# Patient Record
Sex: Female | Born: 1965 | Race: White | Hispanic: No | Smoking: Never smoker
Health system: Southern US, Community
[De-identification: ages and names within clinical notes are randomized; demographics above are authoritative.]

## PROBLEM LIST (undated history)

## (undated) DIAGNOSIS — B009 Herpesviral infection, unspecified: Secondary | ICD-10-CM

## (undated) DIAGNOSIS — Z973 Presence of spectacles and contact lenses: Secondary | ICD-10-CM

## (undated) DIAGNOSIS — E559 Vitamin D deficiency, unspecified: Secondary | ICD-10-CM

## (undated) HISTORY — DX: Vitamin D deficiency, unspecified: E55.9

## (undated) HISTORY — DX: Herpesviral infection, unspecified: B00.9

---

## 1994-08-27 HISTORY — PX: CERVICAL BIOPSY  W/ LOOP ELECTRODE EXCISION: SUR135

## 1998-11-04 ENCOUNTER — Encounter: Payer: Self-pay | Admitting: Emergency Medicine

## 1998-11-04 ENCOUNTER — Emergency Department (HOSPITAL_COMMUNITY): Admission: EM | Admit: 1998-11-04 | Discharge: 1998-11-04 | Payer: Self-pay | Admitting: Emergency Medicine

## 1998-12-05 ENCOUNTER — Encounter: Admission: RE | Admit: 1998-12-05 | Discharge: 1999-01-06 | Payer: Self-pay | Admitting: Family Medicine

## 1999-02-07 ENCOUNTER — Other Ambulatory Visit: Admission: RE | Admit: 1999-02-07 | Discharge: 1999-02-07 | Payer: Self-pay | Admitting: Obstetrics and Gynecology

## 2000-02-16 ENCOUNTER — Other Ambulatory Visit: Admission: RE | Admit: 2000-02-16 | Discharge: 2000-02-16 | Payer: Self-pay | Admitting: Gynecology

## 2001-03-11 ENCOUNTER — Other Ambulatory Visit: Admission: RE | Admit: 2001-03-11 | Discharge: 2001-03-11 | Payer: Self-pay | Admitting: Gynecology

## 2001-10-29 ENCOUNTER — Inpatient Hospital Stay (HOSPITAL_COMMUNITY): Admission: AD | Admit: 2001-10-29 | Discharge: 2001-10-31 | Payer: Self-pay | Admitting: Obstetrics & Gynecology

## 2001-11-26 ENCOUNTER — Other Ambulatory Visit: Admission: RE | Admit: 2001-11-26 | Discharge: 2001-11-26 | Payer: Self-pay | Admitting: Obstetrics & Gynecology

## 2002-11-10 ENCOUNTER — Other Ambulatory Visit: Admission: RE | Admit: 2002-11-10 | Discharge: 2002-11-10 | Payer: Self-pay | Admitting: Gynecology

## 2003-11-18 ENCOUNTER — Other Ambulatory Visit: Admission: RE | Admit: 2003-11-18 | Discharge: 2003-11-18 | Payer: Self-pay | Admitting: Gynecology

## 2004-03-23 ENCOUNTER — Ambulatory Visit (HOSPITAL_BASED_OUTPATIENT_CLINIC_OR_DEPARTMENT_OTHER): Admission: RE | Admit: 2004-03-23 | Discharge: 2004-03-23 | Payer: Self-pay | Admitting: Plastic Surgery

## 2004-03-23 ENCOUNTER — Ambulatory Visit (HOSPITAL_COMMUNITY): Admission: RE | Admit: 2004-03-23 | Discharge: 2004-03-23 | Payer: Self-pay | Admitting: Plastic Surgery

## 2004-03-24 ENCOUNTER — Encounter (INDEPENDENT_AMBULATORY_CARE_PROVIDER_SITE_OTHER): Payer: Self-pay | Admitting: Specialist

## 2004-08-27 DIAGNOSIS — B009 Herpesviral infection, unspecified: Secondary | ICD-10-CM

## 2004-08-27 HISTORY — DX: Herpesviral infection, unspecified: B00.9

## 2004-11-22 ENCOUNTER — Other Ambulatory Visit: Admission: RE | Admit: 2004-11-22 | Discharge: 2004-11-22 | Payer: Self-pay | Admitting: Gynecology

## 2005-11-23 ENCOUNTER — Other Ambulatory Visit: Admission: RE | Admit: 2005-11-23 | Discharge: 2005-11-23 | Payer: Self-pay | Admitting: Gynecology

## 2006-04-22 ENCOUNTER — Ambulatory Visit: Payer: Self-pay | Admitting: Internal Medicine

## 2006-04-27 ENCOUNTER — Ambulatory Visit: Payer: Self-pay | Admitting: Internal Medicine

## 2006-05-27 ENCOUNTER — Ambulatory Visit: Payer: Self-pay | Admitting: Internal Medicine

## 2006-06-27 ENCOUNTER — Ambulatory Visit: Payer: Self-pay | Admitting: Internal Medicine

## 2006-07-27 ENCOUNTER — Ambulatory Visit: Payer: Self-pay | Admitting: Internal Medicine

## 2006-09-06 ENCOUNTER — Ambulatory Visit: Payer: Self-pay | Admitting: Internal Medicine

## 2006-09-27 ENCOUNTER — Ambulatory Visit: Payer: Self-pay | Admitting: Internal Medicine

## 2006-10-26 ENCOUNTER — Ambulatory Visit: Payer: Self-pay | Admitting: Internal Medicine

## 2006-11-26 ENCOUNTER — Ambulatory Visit: Payer: Self-pay | Admitting: Internal Medicine

## 2006-12-26 ENCOUNTER — Ambulatory Visit: Payer: Self-pay | Admitting: Internal Medicine

## 2007-01-26 ENCOUNTER — Ambulatory Visit: Payer: Self-pay | Admitting: Internal Medicine

## 2007-02-25 ENCOUNTER — Ambulatory Visit: Payer: Self-pay | Admitting: Internal Medicine

## 2007-04-01 ENCOUNTER — Ambulatory Visit: Payer: Self-pay | Admitting: Internal Medicine

## 2007-04-28 ENCOUNTER — Ambulatory Visit: Payer: Self-pay | Admitting: Internal Medicine

## 2007-05-28 ENCOUNTER — Ambulatory Visit: Payer: Self-pay | Admitting: Internal Medicine

## 2007-05-30 ENCOUNTER — Other Ambulatory Visit: Admission: RE | Admit: 2007-05-30 | Discharge: 2007-05-30 | Payer: Self-pay | Admitting: Gynecology

## 2007-06-28 ENCOUNTER — Ambulatory Visit: Payer: Self-pay | Admitting: Internal Medicine

## 2007-07-28 ENCOUNTER — Ambulatory Visit: Payer: Self-pay | Admitting: Internal Medicine

## 2007-08-08 ENCOUNTER — Ambulatory Visit: Payer: Self-pay | Admitting: Internal Medicine

## 2007-08-28 ENCOUNTER — Ambulatory Visit: Payer: Self-pay | Admitting: Internal Medicine

## 2007-10-03 ENCOUNTER — Ambulatory Visit: Payer: Self-pay | Admitting: Internal Medicine

## 2007-10-26 ENCOUNTER — Ambulatory Visit: Payer: Self-pay | Admitting: Internal Medicine

## 2007-11-26 ENCOUNTER — Ambulatory Visit: Payer: Self-pay | Admitting: Internal Medicine

## 2007-12-26 ENCOUNTER — Ambulatory Visit: Payer: Self-pay | Admitting: Internal Medicine

## 2008-01-02 ENCOUNTER — Ambulatory Visit: Payer: Self-pay | Admitting: Internal Medicine

## 2008-01-26 ENCOUNTER — Ambulatory Visit: Payer: Self-pay | Admitting: Internal Medicine

## 2008-01-28 ENCOUNTER — Other Ambulatory Visit: Admission: RE | Admit: 2008-01-28 | Discharge: 2008-01-28 | Payer: Self-pay | Admitting: Gynecology

## 2008-02-25 ENCOUNTER — Ambulatory Visit: Payer: Self-pay | Admitting: Internal Medicine

## 2008-03-01 ENCOUNTER — Ambulatory Visit: Payer: Self-pay | Admitting: Internal Medicine

## 2008-03-27 ENCOUNTER — Ambulatory Visit: Payer: Self-pay | Admitting: Internal Medicine

## 2008-09-27 ENCOUNTER — Ambulatory Visit: Payer: Self-pay | Admitting: Internal Medicine

## 2008-10-21 ENCOUNTER — Ambulatory Visit: Payer: Self-pay | Admitting: Internal Medicine

## 2008-10-25 ENCOUNTER — Ambulatory Visit: Payer: Self-pay | Admitting: Internal Medicine

## 2008-12-14 ENCOUNTER — Ambulatory Visit: Payer: Self-pay | Admitting: Gynecology

## 2009-01-06 ENCOUNTER — Other Ambulatory Visit: Admission: RE | Admit: 2009-01-06 | Discharge: 2009-01-06 | Payer: Self-pay | Admitting: Gynecology

## 2009-01-06 ENCOUNTER — Encounter: Payer: Self-pay | Admitting: Gynecology

## 2009-01-06 ENCOUNTER — Ambulatory Visit: Payer: Self-pay | Admitting: Gynecology

## 2009-01-25 ENCOUNTER — Ambulatory Visit: Payer: Self-pay | Admitting: Gynecology

## 2009-01-25 ENCOUNTER — Ambulatory Visit: Payer: Self-pay | Admitting: Internal Medicine

## 2009-02-07 ENCOUNTER — Ambulatory Visit: Payer: Self-pay | Admitting: Internal Medicine

## 2009-02-24 ENCOUNTER — Ambulatory Visit: Payer: Self-pay | Admitting: Internal Medicine

## 2009-04-04 ENCOUNTER — Ambulatory Visit: Payer: Self-pay | Admitting: Women's Health

## 2009-08-27 ENCOUNTER — Ambulatory Visit: Payer: Self-pay | Admitting: Internal Medicine

## 2009-08-29 ENCOUNTER — Ambulatory Visit: Payer: Self-pay | Admitting: Internal Medicine

## 2009-09-27 ENCOUNTER — Ambulatory Visit: Payer: Self-pay | Admitting: Internal Medicine

## 2009-10-17 ENCOUNTER — Ambulatory Visit: Payer: Self-pay | Admitting: Women's Health

## 2009-10-28 ENCOUNTER — Ambulatory Visit: Payer: Self-pay | Admitting: Internal Medicine

## 2009-11-25 ENCOUNTER — Ambulatory Visit: Payer: Self-pay | Admitting: Internal Medicine

## 2010-05-25 ENCOUNTER — Ambulatory Visit: Payer: Self-pay | Admitting: Women's Health

## 2010-06-28 ENCOUNTER — Ambulatory Visit: Payer: Self-pay | Admitting: Internal Medicine

## 2010-07-27 ENCOUNTER — Ambulatory Visit: Payer: Self-pay | Admitting: Internal Medicine

## 2010-12-20 ENCOUNTER — Encounter: Payer: Self-pay | Admitting: Women's Health

## 2011-01-12 NOTE — Op Note (Signed)
Tina Allen, Tina Allen                           ACCOUNT NO.:  [id number]   MEDICAL RECORD NO.:  [id number]                   PATIENT TYPE:  AMB   LOCATION:  DSC                                  FACILITY:  [facility]   PHYSICIAN:  Brantley Persons, M.D.             DATE OF BIRTH:  11/29/1965   DATE OF PROCEDURE:  03/23/2004  DATE OF DISCHARGE:                                 OPERATIVE REPORT   PREOPERATIVE DIAGNOSES:  1. 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  2. Suspicious skin lesion of left flank area.  3. Suspicious skin lesion of medial right breast.   POSTOPERATIVE DIAGNOSES:  1. 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  2. Suspicious skin lesion of left flank area.  3. Suspicious skin lesion of medial right breast.   OPERATION PERFORMED:  1. Wide local excision of 2.5 cm right thigh dysplastic nevus/possible early     melanoma.  2. Complex closure of 6.0 cm right anterior thigh incision.  3. Excision of 0.4 cm left flank suspicious skin lesion.  4. Excision of 0.7 cm suspicious skin lesion, right medial breast.  5. Intermediate closure of 1.3 cm right medial breast skin incision.   SURGEON:  Brantley Persons, M.D.   ANESTHESIA:  1% lidocaine with epinephrine.   COMPLICATIONS:  None.   INDICATIONS FOR PROCEDURE:  The patient is a 45 year old Caucasian female  who is referred by Dr. Campbell Stall for wide local excision of a dysplastic  nevus of the right anterior thigh that shows marked atypia and borders on  early evolving melanoma along with suspicious skin lesions on the left flank  and right medial breast areas.  The patient is an appropriate candidate and  would like to proceed with these surgeries.   DESCRIPTION OF PROCEDURE:  The patient was brought into the minor room and  placed on the table in the supine position.  The  left flank, right medial  breast and right anterior thigh operative areas were then infiltrated with  1% lidocaine with epinephrine.  All of the skin in these areas was also  prepped with soap, Betadine and alcohol and then draped in sterile fashion.  After adequate hemostasis and anesthesia had taken effect, the procedure was  begun.   I first proceeded with excision of the left flank suspicious skin lesion.  This skin lesion was excised full thickness through the skin into the  subcutaneous tissue with at least 1 mm margins.  This specimen was marked at  the 12 o'clock position and then passed off the table to undergo permanent  pathologic section evaluation.  The incision was then closed with 5-0  Prolene interrupted simple sutures.  This incision was then dressed with  Bacitracin ointment and a light gauze dressing. Attention was then turned to  the right medial breast.  This suspicious skin lesion was examined using  loupe magnification and 1 to 2 mm margins were marked circumferentially  around the skin lesion.  The skin lesion was thus excised with appropriate  clinical skin margins. This specimen was marked at the 12 o'clock position  and passed off the table to undergo permanent pathologic section evaluation.  The total length of excision was 0.8 cm.  The incision was then closed in  intermediate grade fashion.  The deeper subcutaneous tissues and deep dermal  layers were closed using 3-0 Monocryl suture.  The skin was then closed with  a running 4-0 Monocryl intracuticular suture.  The incision was dressed with  benzoin and Steri-Strips followed by a light gauze dressing.  Attention was  then turned to the right anterior thigh.  Again using loupe magnification,  at least 5 mm borders were marked circumferentially around the healing  biopsy site and persistent skin lesion.  The skin lesion was thus excised  full thickness through the skin into the subcutaneous tissues and  actually  down to the muscle fascial layer.  The specimen was marked at the 12 o'clock  position and then passed off the table to undergo permanent pathologic  section evaluation.  The total length of excised skin lesion with the  appropriate margins was 2.5 cm.  The wound closure then was performed in  complex fashion.  The subfascial layers were closed using 2-0 Monocryl  suture.  The deep dermis was then closed using a 3-0 Monocryl suture.  The  skin was then closed with a 3-0 Monocryl suture in an intracuticular  pattern.  Benzoin and Steri-Strips followed by a light gauze dressing was  applied.  There were no complications.  The patient tolerated the procedure  well.  She was then given proper postoperative wound care instructions and  discharged home in stable condition.  Follow-up appointment will be tomorrow  in the office.                                               Brantley Persons, M.D.    MC/MEDQ  D:  03/24/2004  T:  03/24/2004  Job:  [id number]

## 2011-08-03 ENCOUNTER — Telehealth: Payer: Self-pay | Admitting: *Deleted

## 2011-08-03 NOTE — Telephone Encounter (Signed)
Patient called to say she was having problems with gas and reflux since last month.  Advised the patient to see a PCP to evaluate.

## 2011-09-05 ENCOUNTER — Ambulatory Visit: Payer: Self-pay | Admitting: Women's Health

## 2011-09-06 ENCOUNTER — Other Ambulatory Visit: Payer: Self-pay | Admitting: Gynecology

## 2011-09-06 ENCOUNTER — Other Ambulatory Visit (HOSPITAL_COMMUNITY)
Admission: RE | Admit: 2011-09-06 | Discharge: 2011-09-06 | Disposition: A | Discharge status: Home / Self Care | Payer: Self-pay | Source: Ambulatory Visit | Attending: Gynecology | Admitting: Gynecology

## 2011-09-06 ENCOUNTER — Ambulatory Visit (INDEPENDENT_AMBULATORY_CARE_PROVIDER_SITE_OTHER): Payer: Self-pay | Admitting: Gynecology

## 2011-09-06 ENCOUNTER — Encounter: Payer: Self-pay | Admitting: Gynecology

## 2011-09-06 DIAGNOSIS — Z01419 Encounter for gynecological examination (general) (routine) without abnormal findings: Secondary | ICD-10-CM

## 2011-09-06 DIAGNOSIS — A499 Bacterial infection, unspecified: Secondary | ICD-10-CM

## 2011-09-06 DIAGNOSIS — N760 Acute vaginitis: Secondary | ICD-10-CM

## 2011-09-06 DIAGNOSIS — N898 Other specified noninflammatory disorders of vagina: Secondary | ICD-10-CM

## 2011-09-06 DIAGNOSIS — Z23 Encounter for immunization: Secondary | ICD-10-CM

## 2011-09-06 DIAGNOSIS — N949 Unspecified condition associated with female genital organs and menstrual cycle: Secondary | ICD-10-CM

## 2011-09-06 LAB — CBC WITH DIFFERENTIAL/PLATELET
Basophils Absolute: 0 10*3/uL (ref 0.0–0.1)
Basophils Relative: 0 % (ref 0–1)
HCT: 40.6 % (ref 36.0–46.0)
Hemoglobin: 13.4 g/dL (ref 12.0–15.0)
Lymphocytes Relative: 39 % (ref 12–46)
Monocytes Absolute: 0.4 10*3/uL (ref 0.1–1.0)
Monocytes Relative: 5 % (ref 3–12)
Neutro Abs: 4 10*3/uL (ref 1.7–7.7)
Neutrophils Relative %: 53 % (ref 43–77)
WBC: 7.5 10*3/uL (ref 4.0–10.5)

## 2011-09-06 LAB — URINALYSIS W MICROSCOPIC + REFLEX CULTURE
Bilirubin Urine: NEGATIVE
Crystals: NONE SEEN
Glucose, UA: NEGATIVE mg/dL
Protein, ur: NEGATIVE mg/dL
RBC / HPF: NONE SEEN RBC/hpf (ref <3)
Specific Gravity, Urine: 1.015 (ref 1.005–1.030)
Urobilinogen, UA: 0.2 mg/dL (ref 0.0–1.0)

## 2011-09-06 LAB — WET PREP FOR TRICH, YEAST, CLUE
Trich, Wet Prep: NONE SEEN
Yeast Wet Prep HPF POC: NONE SEEN

## 2011-09-06 LAB — COMPREHENSIVE METABOLIC PANEL
Albumin: 4.7 g/dL (ref 3.5–5.2)
BUN: 10 mg/dL (ref 6–23)
CO2: 28 mEq/L (ref 19–32)
Calcium: 9.5 mg/dL (ref 8.4–10.5)
Chloride: 104 mEq/L (ref 96–112)
Creat: 0.9 mg/dL (ref 0.50–1.10)
Glucose, Bld: 70 mg/dL (ref 70–99)
Potassium: 3.9 mEq/L (ref 3.5–5.3)

## 2011-09-06 LAB — IRON AND TIBC
%SAT: 28 % (ref 20–55)
Iron: 77 ug/dL (ref 42–145)
TIBC: 277 ug/dL (ref 250–470)
UIBC: 200 ug/dL (ref 125–400)

## 2011-09-06 MED ORDER — METRONIDAZOLE 500 MG PO TABS: 500.0000 mg | ORAL_TABLET | Freq: Two times a day (BID) | ORAL | Status: AC

## 2011-09-06 NOTE — Patient Instructions (Addendum)
Would encourage calcium with vitamin D one tablet daily for osteoporosis prevention.Remember to schedule your mammogram.Prescription in pharmacy.  Breast Self-Exam A self breast exam may help you find changes or problems while they are still small. Do a breast self-exam:  Every month.   One week after your period (menstrual period).   On the first day of each month if you do not have periods anymore.  Look for any:  Change in breast color, size, or shape.   Dimples in your breast.   Changes in your nipples or skin.   Dry skin on your breasts or nipples.   Watery or bloody discharge from your nipples.   Feel for:  Lumps.   Thick, hard places.   Any other changes.  HOME CARE There are 3 ways to do the breast self-exam: In front of a mirror.  Lift your arms over your head and turn side to side.   Put your hands on your hips and lean down, then turn from side to side.   Bend forward and turn from side to side.  In the shower.  With soapy hands, check both breasts. Then check above and below your collarbone and your armpits.   Feel above and below your collarbone down to under your breast, and from the center of your chest to the outer edge of the armpit. Check for any lumps or hard spots.   Using the tips of your middle three fingers check your whole breast by pressing your hand over your breast in a circle or in an up and down motion.  Lying down.  Lie flat on your bed.   Put a small pillow under the breast you are going to check. On that same side, put your hand behind your head.   With your other hand, use the 3 middle fingers to feel the breast.   Move your fingers in a circle around the breast. Press firmly over all parts of the breast to feel for any lumps.  GET HELP RIGHT AWAY IF: You find any changes in your breasts so they can be checked. Document Released: 01/30/2008 Document Revised: 04/25/2011 Document Reviewed: 12/01/2008 [facility] Patient Information  2012 [place], [place].

## 2011-09-06 NOTE — Progress Notes (Signed)
Tina Allen Aug 31, 1965 [id number]   History:    46 y.o.  for annual exam who was complaining of vaginal discharge with odor. She states that time her cycles are getting heavier. The last time she was seen in the office was in 2011. Review of her record indicated she had a LEEP cervical conization back in 1996 followup Pap smears have been normal. Patient was diagnosed with hemochromatosis and has a family history. She underwent phlebotomy at [facility] in [facility] by hematologist. Patient is not using any form of contraception cycles reported to be normal in frequently does her self breast examination and mammogram was done over year ago. Patient requesting a flu vaccine.    Past medical history,surgical history, family history and social history were all reviewed and documented in the EPIC chart.  Gynecologic History Patient's last menstrual period was 08/30/2011. Contraception: none Last Pap: 2011. Results were: normal Last mammogram: Over year ago. Results were: normal  Obstetric History OB History    Grav Para Term Preterm Abortions TAB SAB Ect Mult Living   2 1 1  1  1   1      # Outc Date GA Lbr Len/2nd Wgt Sex Del Anes PTL Lv   1 TRM     F SVD  No Yes   2 SAB                ROS:  Was performed and pertinent positives and negatives are included in the history.  Exam: chaperone present  BP 118/72  Ht 5\' 4"  (1.626 m)  Wt 150 lb (68.04 kg)  BMI 25.75 kg/m2  LMP 08/30/2011  Body mass index is 25.75 kg/(m^2).  General appearance : Well developed well nourished female. No acute distress HEENT: Neck supple, trachea midline, no carotid bruits, no thyroidmegaly Lungs: Clear to auscultation, no rhonchi or wheezes, or rib retractions  Heart: Regular rate and rhythm, no murmurs or gallops Breast:Examined in sitting and supine position were symmetrical in appearance, no palpable masses or tenderness,  no skin retraction, no nipple inversion,  no nipple discharge, no skin discoloration, no axillary or supraclavicular lymphadenopathy Abdomen: no palpable masses or tenderness, no rebound or guarding Extremities: no edema or skin discoloration or tenderness  Pelvic:  Bartholin, Urethra, Skene Glands: Within normal limits             Vagina: No gross lesions or discharge  Cervix: No gross lesions or discharge  Uterus  anteverted, normal size, shape and consistency, non-tender and mobile  Adnexa  Without masses or tenderness  Anus and perineum  normal   Rectovaginal  normal sphincter tone without palpated masses or tenderness             Hemoccult not done  Wet prep demonstrated white blood cells /clue cells present     Assessment/Plan:  46 y.o. female for annual exam with evidence of bacterial vaginosis. She will be placed on Flagyl 500 mg twice a day for 5 days. Because of her history of hemochromatosis a full anemia panel will be drawn today along with a comprehensive metabolic panel, CBC, urinalysis and Pap smear. She was encouraged to do her monthly self breast examination and a requisition was provided for to schedule her mammogram. Flu vaccine was administered today. Patient also informed that there is family history of epidermolysis bullosa hereditaria in her brother and father which is inherited in males. We'll see patient one year or when necessary.   Ok Edwards MD,  5:21 PM 09/06/2011

## 2011-09-07 LAB — VITAMIN B12: Vitamin B-12: 420 pg/mL (ref 211–911)

## 2011-09-07 LAB — FERRITIN: Ferritin: 16 ng/mL (ref 10–291)

## 2011-09-07 LAB — FOLATE: Folate: 12.8 ng/mL

## 2011-09-08 LAB — URINE CULTURE: Organism ID, Bacteria: NO GROWTH

## 2011-10-05 ENCOUNTER — Telehealth: Payer: Self-pay | Admitting: *Deleted

## 2011-10-05 MED ORDER — FLUCONAZOLE 150 MG PO TABS: 150.0000 mg | ORAL_TABLET | Freq: Once | ORAL | Status: AC

## 2011-10-05 NOTE — Telephone Encounter (Signed)
Pt was seen on 09/06/11 for annual and given flagyl 500 mg for BV. Pt took all medication and now for 2 days having itching, white discharge and slight odor. Pt would like rx to help relieve symptoms. Please advise

## 2011-10-05 NOTE — Telephone Encounter (Signed)
Pt informed with the below note, rx sent pt pharmacy. 

## 2011-10-05 NOTE — Telephone Encounter (Signed)
For patient's apparent yeast infection she can have Diflucan 150 mg one by mouth today and 2 refills.

## 2011-11-30 ENCOUNTER — Ambulatory Visit (INDEPENDENT_AMBULATORY_CARE_PROVIDER_SITE_OTHER): Payer: Self-pay | Admitting: Women's Health

## 2011-11-30 ENCOUNTER — Encounter: Payer: Self-pay | Admitting: Women's Health

## 2011-11-30 DIAGNOSIS — N760 Acute vaginitis: Secondary | ICD-10-CM

## 2011-11-30 DIAGNOSIS — B9689 Other specified bacterial agents as the cause of diseases classified elsewhere: Secondary | ICD-10-CM

## 2011-11-30 DIAGNOSIS — N898 Other specified noninflammatory disorders of vagina: Secondary | ICD-10-CM

## 2011-11-30 DIAGNOSIS — A499 Bacterial infection, unspecified: Secondary | ICD-10-CM

## 2011-11-30 LAB — WET PREP FOR TRICH, YEAST, CLUE: Yeast Wet Prep HPF POC: NONE SEEN

## 2011-11-30 MED ORDER — METRONIDAZOLE 500 MG PO TABS: 500.0000 mg | ORAL_TABLET | Freq: Two times a day (BID) | ORAL | Status: AC

## 2011-11-30 NOTE — Patient Instructions (Signed)
Bacterial Vaginosis Bacterial vaginosis (BV) is a vaginal infection where the normal balance of bacteria in the vagina is disrupted. The normal balance is then replaced by an overgrowth of certain bacteria. There are several different kinds of bacteria that can cause BV. BV is the most common vaginal infection in women of childbearing age. CAUSES   The cause of BV is not fully understood. BV develops when there is an increase or imbalance of harmful bacteria.   Some activities or behaviors can upset the normal balance of bacteria in the vagina and put women at increased risk including:   Having a new sex partner or multiple sex partners.   Douching.   Using an intrauterine device (IUD) for contraception.   It is not clear what role sexual activity plays in the development of BV. However, women that have never had sexual intercourse are rarely infected with BV.  Women do not get BV from toilet seats, bedding, swimming pools or from touching objects around them.  SYMPTOMS   Grey vaginal discharge.   A fish-like odor with discharge, especially after sexual intercourse.   Itching or burning of the vagina and vulva.   Burning or pain with urination.   Some women have no signs or symptoms at all.  DIAGNOSIS  Your caregiver must examine the vagina for signs of BV. Your caregiver will perform lab tests and look at the sample of vaginal fluid through a microscope. They will look for bacteria and abnormal cells (clue cells), a pH test higher than 4.5, and a positive amine test all associated with BV.  RISKS AND COMPLICATIONS   Pelvic inflammatory disease (PID).   Infections following gynecology surgery.   Developing HIV.   Developing herpes virus.  TREATMENT  Sometimes BV will clear up without treatment. However, all women with symptoms of BV should be treated to avoid complications, especially if gynecology surgery is planned. Female partners generally do not need to be treated. However,  BV may spread between female sex partners so treatment is helpful in preventing a recurrence of BV.   BV may be treated with antibiotics. The antibiotics come in either pill or vaginal cream forms. Either can be used with nonpregnant or pregnant women, but the recommended dosages differ. These antibiotics are not harmful to the baby.   BV can recur after treatment. If this happens, a second round of antibiotics will often be prescribed.   Treatment is important for pregnant women. If not treated, BV can cause a premature delivery, especially for a pregnant woman who had a premature birth in the past. All pregnant women who have symptoms of BV should be checked and treated.   For chronic reoccurrence of BV, treatment with a type of prescribed gel vaginally twice a week is helpful.  HOME CARE INSTRUCTIONS   Finish all medication as directed by your caregiver.   Do not have sex until treatment is completed.   Tell your sexual partner that you have a vaginal infection. They should see their caregiver and be treated if they have problems, such as a mild rash or itching.   Practice safe sex. Use condoms. Only have 1 sex partner.  PREVENTION  Basic prevention steps can help reduce the risk of upsetting the natural balance of bacteria in the vagina and developing BV:  Do not have sexual intercourse (be abstinent).   Do not douche.   Use all of the medicine prescribed for treatment of BV, even if the signs and symptoms go away.     Tell your sex partner if you have BV. That way, they can be treated, if needed, to prevent reoccurrence.  SEEK MEDICAL CARE IF:   Your symptoms are not improving after 3 days of treatment.   You have increased discharge, pain, or fever.  MAKE SURE YOU:   Understand these instructions.   Will watch your condition.   Will get help right away if you are not doing well or get worse.  FOR MORE INFORMATION  Division of STD Prevention (DSTDP), Centers for Disease  Control and Prevention: www.cdc.gov/std American Social Health Association (ASHA): www.ashastd.org  Document Released: 08/13/2005 Document Revised: 08/02/2011 Document Reviewed: 02/03/2009 [employer] Patient Information 2012 [employer]. 

## 2011-11-30 NOTE — Progress Notes (Signed)
Patient ID: Tina Allen, female   DOB: [birth year], 46 y.o.   MRN: [id number] Presents with a complaint of a discharge with an odor. Denies any urinary symptoms. One partner negative cultures. Monthly cycles/condoms for contraception.  Exam: External genitalia is slightly erythematous and introitus, speculum exam scant clear discharge with an odor was present. Wet prep positive for amines and clues. Bimanual no CMT no adenexal  fullness or tenderness.  Bacteria vaginosis  Plan: Flagyl 500 by mouth twice a day for 7 days, alcohol precautions reviewed, call or return if no relief of symptoms. Continue condoms for contraception. Overdue for annual exam instructed to schedule

## 2012-03-10 ENCOUNTER — Telehealth: Payer: Self-pay | Admitting: *Deleted

## 2012-03-10 ENCOUNTER — Other Ambulatory Visit: Payer: Self-pay | Admitting: Women's Health

## 2012-03-10 DIAGNOSIS — B9689 Other specified bacterial agents as the cause of diseases classified elsewhere: Secondary | ICD-10-CM

## 2012-03-10 DIAGNOSIS — N760 Acute vaginitis: Secondary | ICD-10-CM

## 2012-03-10 MED ORDER — METRONIDAZOLE 0.75 % VA GEL: VAGINAL | Status: AC

## 2012-03-10 NOTE — Telephone Encounter (Signed)
Telephone call, reviewed flagyl helped but problem returned, states happens every month before cycle.  Same partner, no other changes.  Will try metrogel 1 applicator HS for 5 nights then 1 applicator prior to cycle.  Office visit if no relief

## 2012-03-10 NOTE — Telephone Encounter (Signed)
Pt calling c/o BV infection odor with discharge, Ov offered pt declined she doesn't have money for ov and rx. She would like to know what you recommend her to take OTC that would help with this infection. Please advise

## 2012-04-08 ENCOUNTER — Encounter: Payer: Self-pay | Admitting: Gynecology

## 2012-04-08 ENCOUNTER — Ambulatory Visit (INDEPENDENT_AMBULATORY_CARE_PROVIDER_SITE_OTHER): Payer: Self-pay | Admitting: Gynecology

## 2012-04-08 VITALS — BP 120/82

## 2012-04-08 DIAGNOSIS — R350 Frequency of micturition: Secondary | ICD-10-CM

## 2012-04-08 DIAGNOSIS — N390 Urinary tract infection, site not specified: Secondary | ICD-10-CM

## 2012-04-08 LAB — URINALYSIS W MICROSCOPIC + REFLEX CULTURE
Casts: NONE SEEN
Ketones, ur: NEGATIVE mg/dL
Nitrite: NEGATIVE
Protein, ur: >300 mg/dL — AB
Urobilinogen, UA: 0.2 mg/dL (ref 0.0–1.0)
pH: 5.5 (ref 5.0–8.0)

## 2012-04-08 MED ORDER — NITROFURANTOIN MONOHYD MACRO 100 MG PO CAPS: 100.0000 mg | ORAL_CAPSULE | Freq: Two times a day (BID) | ORAL | Status: AC

## 2012-04-08 MED ORDER — FLUCONAZOLE 100 MG PO TABS: ORAL_TABLET | ORAL | Status: DC

## 2012-04-08 MED ORDER — PHENAZOPYRIDINE HCL 95 MG PO TABS: 95.0000 mg | ORAL_TABLET | Freq: Three times a day (TID) | ORAL | Status: AC | PRN

## 2012-04-08 NOTE — Patient Instructions (Signed)

## 2012-04-08 NOTE — Progress Notes (Signed)
46 year old patient who presented to the office today complaining today history of dysuria and frequency. Patient denies any fever chills nausea vomiting her back pain. She denied any vaginal discharge. Her menstrual cycles reported to be regular and her husband has had a vasectomy.  Exam: Abdomen: Soft nontender no rebound or guarding Pelvic: Bartholin urethra Skene was within normal limits Vagina: No lesion discharge Cervix: No lesions or discharge Uterus: Anteverted normal size shape and consistency some suprapubic tenderness are noted Adnexa: No palpable masses or tenderness Rectal exam: Not done  Urinalysis demonstrated tumors to count white blood cells, 11-20 RBC, and few bacteria.  Assessment/plan: Urinary tract infection. She will prescribe Macrobid to take 1 by mouth twice a day for 7 days.  In 200 mg one by mouth 3 times a day for to 3 days as an anti-spasmodic agent. Since patient states that she is very sensitive to antibiotics and is prone to yeast infection a prescription for Diflucan 100 mg to take 1 by mouth after the antibiotics was prescribed. Patient states at times she suffers from menorrhagia and was interested in the her option endometrial ablation technique. Literature formation was provided and she will read it and let us know and we will schedule accordingly.

## 2012-04-11 LAB — URINE CULTURE: Colony Count: >=100000

## 2013-04-17 ENCOUNTER — Encounter: Payer: Self-pay | Admitting: Women's Health

## 2013-04-23 ENCOUNTER — Encounter: Payer: Self-pay | Admitting: Women's Health

## 2013-04-23 ENCOUNTER — Ambulatory Visit (INDEPENDENT_AMBULATORY_CARE_PROVIDER_SITE_OTHER): Payer: BC Managed Care – PPO | Admitting: Women's Health

## 2013-04-23 ENCOUNTER — Other Ambulatory Visit (HOSPITAL_COMMUNITY)
Admission: RE | Admit: 2013-04-23 | Discharge: 2013-04-23 | Disposition: A | Discharge status: Home / Self Care | Payer: BC Managed Care – PPO | Source: Ambulatory Visit | Attending: Gynecology | Admitting: Gynecology

## 2013-04-23 VITALS — BP 112/74 | Ht 65.0 in | Wt 140.0 lb

## 2013-04-23 DIAGNOSIS — Z13220 Encounter for screening for lipoid disorders: Secondary | ICD-10-CM

## 2013-04-23 DIAGNOSIS — Z833 Family history of diabetes mellitus: Secondary | ICD-10-CM

## 2013-04-23 DIAGNOSIS — Z01419 Encounter for gynecological examination (general) (routine) without abnormal findings: Secondary | ICD-10-CM | POA: Insufficient documentation

## 2013-04-23 DIAGNOSIS — F418 Other specified anxiety disorders: Secondary | ICD-10-CM

## 2013-04-23 DIAGNOSIS — F411 Generalized anxiety disorder: Secondary | ICD-10-CM

## 2013-04-23 DIAGNOSIS — N879 Dysplasia of cervix uteri, unspecified: Secondary | ICD-10-CM | POA: Insufficient documentation

## 2013-04-23 DIAGNOSIS — E079 Disorder of thyroid, unspecified: Secondary | ICD-10-CM

## 2013-04-23 DIAGNOSIS — F341 Dysthymic disorder: Secondary | ICD-10-CM

## 2013-04-23 LAB — CBC WITH DIFFERENTIAL/PLATELET
Basophils Relative: 1 % (ref 0–1)
Eosinophils Absolute: 0.1 10*3/uL (ref 0.0–0.7)
MCH: 31.2 pg (ref 26.0–34.0)
MCHC: 33.7 g/dL (ref 30.0–36.0)
Neutro Abs: 4.2 10*3/uL (ref 1.7–7.7)
Neutrophils Relative %: 55 % (ref 43–77)
Platelets: 223 10*3/uL (ref 150–400)
RBC: 4.43 MIL/uL (ref 3.87–5.11)

## 2013-04-23 MED ORDER — ESCITALOPRAM OXALATE 10 MG PO TABS: 10.0000 mg | ORAL_TABLET | Freq: Every day | ORAL | Status: DC

## 2013-04-23 NOTE — Progress Notes (Signed)
Tina Allen 10-22-1965 [id number]    History:    The patient presents for annual exam.  Monthly cycle/vasectomy. HSV history rare outbreaks. LEEP 1996, LGSIL with negative colposcopy/biopsy 2010, normal Paps after. History of hemochromatosis phlebotomy in the past has not had recent followup. Normal mammogram history, overdue.   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 Mother hypertension, aneurysmal 22, father stroke 15. Maternal grandmother breast cancer.   ROS:  A  ROS was performed and pertinent positives and negatives are included in the history.  Exam:  Filed Vitals:   04/23/13 1501  BP: 112/74    General appearance:  Normal Head/Neck:  Normal, without cervical or supraclavicular adenopathy. Thyroid:  Symmetrical, normal in size, without palpable masses or nodularity. Respiratory  Effort:  Normal  Auscultation:  Clear without wheezing or rhonchi Cardiovascular  Auscultation:  Regular rate, without rubs, murmurs or gallops  Edema/varicosities:  Not grossly evident Abdominal  Soft,nontender, without masses, guarding or rebound.  Liver/spleen:  No organomegaly noted  Hernia:  None appreciated  Skin  Inspection:  Grossly normal  Palpation:  Grossly normal Neurologic/psychiatric  Orientation:  Normal with appropriate conversation.  Mood/affect:  Normal  Genitourinary    Breasts: Examined lying and sitting.     Right: Without masses, retractions, discharge or axillary adenopathy.     Left: Without masses, retractions, discharge or axillary adenopathy.   Inguinal/mons:  Normal without inguinal adenopathy  External genitalia:  Normal  BUS/Urethra/Skene's glands:  Normal  Bladder:  Normal  Vagina:  Normal  Cervix:  Normal  Uterus:   normal in size, shape and contour.  Midline and mobile  Adnexa/parametria:     Rt: Without masses or  tenderness.   Lt: Without masses or tenderness.  Anus and perineum: Normal  Digital rectal exam: Normal sphincter tone without palpated masses or tenderness  Assessment/Plan:  47 y.o. M. WF G2 P1 for annual exam.     Situational stress her/impending separation LEEP 1996, LGSIL 2010 normal Paps after Hemochromatosis  Plan: SBE's, reviewed importance of an annual mammogram, instructed to schedule. Continue regular exercise, calcium rich diet, vitamin D 1000 daily encouraged. CBC, glucose, lipid panel, TSH, UA, Pap. Valtrex 500 twice daily for 3-5 days as needed prescription, proper use given and reviewed. Options reviewed for depression.  Has been on Lexapro, Prozac, Zoloft in the past. Will try Lexapro 10 mg daily, prescription, proper use given and reviewed instructed to call if no relief, strongly encouraged to resume counseling.   Harrington Challenger [facility], 5:36 PM 04/23/2013

## 2013-04-23 NOTE — Patient Instructions (Addendum)

## 2013-04-24 LAB — URINALYSIS W MICROSCOPIC + REFLEX CULTURE
Casts: NONE SEEN
Glucose, UA: NEGATIVE mg/dL
Specific Gravity, Urine: 1.03 (ref 1.005–1.030)
pH: 5.5 (ref 5.0–8.0)

## 2013-04-24 LAB — LIPID PANEL
Cholesterol: 200 mg/dL (ref 0–200)
HDL: 54 mg/dL (ref >39)
LDL Cholesterol: 118 mg/dL — ABNORMAL HIGH (ref 0–99)
Total CHOL/HDL Ratio: 3.7 ratio
Triglycerides: 140 mg/dL (ref <150)
VLDL: 28 mg/dL (ref 0–40)

## 2013-04-24 LAB — GLUCOSE, RANDOM: Glucose, Bld: 86 mg/dL (ref 70–99)

## 2013-04-25 LAB — URINE CULTURE
Colony Count: NO GROWTH
Organism ID, Bacteria: NO GROWTH

## 2013-05-06 ENCOUNTER — Encounter: Payer: Self-pay | Admitting: Women's Health

## 2013-05-11 ENCOUNTER — Telehealth: Payer: Self-pay | Admitting: *Deleted

## 2013-05-11 NOTE — Telephone Encounter (Signed)
(  pt aware you are out of the office) Pt calling requesting Rx for Terazol due to yeast infection. Please advise

## 2013-05-12 MED ORDER — TERCONAZOLE 0.8 % VA CREA: 1.0000 | TOPICAL_CREAM | Freq: Every day | VAGINAL | Status: DC

## 2013-05-12 NOTE — Telephone Encounter (Signed)
Please call in Terazol 3, if no relief office visit.

## 2013-05-12 NOTE — Telephone Encounter (Signed)
rx sent, left on voicemail this has been done and the below.

## 2014-02-05 ENCOUNTER — Encounter: Payer: Self-pay | Admitting: Women's Health

## 2014-02-10 ENCOUNTER — Encounter: Payer: Self-pay | Admitting: Women's Health

## 2014-04-27 ENCOUNTER — Encounter: Payer: Self-pay | Admitting: Women's Health

## 2014-04-27 ENCOUNTER — Ambulatory Visit (INDEPENDENT_AMBULATORY_CARE_PROVIDER_SITE_OTHER): Payer: BC Managed Care – PPO | Admitting: Women's Health

## 2014-04-27 ENCOUNTER — Other Ambulatory Visit (HOSPITAL_COMMUNITY)
Admission: RE | Admit: 2014-04-27 | Discharge: 2014-04-27 | Disposition: A | Discharge status: Home / Self Care | Payer: BC Managed Care – PPO | Source: Ambulatory Visit | Attending: Women's Health | Admitting: Women's Health

## 2014-04-27 VITALS — BP 116/72 | Ht 64.5 in | Wt 144.2 lb

## 2014-04-27 DIAGNOSIS — Z1151 Encounter for screening for human papillomavirus (HPV): Secondary | ICD-10-CM | POA: Diagnosis present

## 2014-04-27 DIAGNOSIS — N920 Excessive and frequent menstruation with regular cycle: Secondary | ICD-10-CM

## 2014-04-27 DIAGNOSIS — Z01419 Encounter for gynecological examination (general) (routine) without abnormal findings: Secondary | ICD-10-CM | POA: Insufficient documentation

## 2014-04-27 DIAGNOSIS — F411 Generalized anxiety disorder: Secondary | ICD-10-CM

## 2014-04-27 DIAGNOSIS — Z13220 Encounter for screening for lipoid disorders: Secondary | ICD-10-CM

## 2014-04-27 LAB — CBC WITH DIFFERENTIAL/PLATELET
BASOS ABS: 0 10*3/uL (ref 0.0–0.1)
BASOS PCT: 1 % (ref 0–1)
EOS PCT: 1 % (ref 0–5)
Eosinophils Absolute: 0 10*3/uL (ref 0.0–0.7)
HEMATOCRIT: 34.1 % — AB (ref 36.0–46.0)
Hemoglobin: 11.3 g/dL — ABNORMAL LOW (ref 12.0–15.0)
Lymphocytes Relative: 36 % (ref 12–46)
Lymphs Abs: 1.7 10*3/uL (ref 0.7–4.0)
MCH: 29.5 pg (ref 26.0–34.0)
MCHC: 33.1 g/dL (ref 30.0–36.0)
MCV: 89 fL (ref 78.0–100.0)
MONO ABS: 0.2 10*3/uL (ref 0.1–1.0)
Monocytes Relative: 5 % (ref 3–12)
Neutro Abs: 2.7 10*3/uL (ref 1.7–7.7)
Neutrophils Relative %: 57 % (ref 43–77)
Platelets: 275 10*3/uL (ref 150–400)
RBC: 3.83 MIL/uL — ABNORMAL LOW (ref 3.87–5.11)
RDW: 13.5 % (ref 11.5–15.5)
WBC: 4.8 10*3/uL (ref 4.0–10.5)

## 2014-04-27 LAB — LIPID PANEL
Cholesterol: 202 mg/dL — ABNORMAL HIGH (ref 0–200)
HDL: 52 mg/dL (ref >39)
LDL CALC: 127 mg/dL — AB (ref 0–99)
Total CHOL/HDL Ratio: 3.9 Ratio
Triglycerides: 114 mg/dL (ref <150)
VLDL: 23 mg/dL (ref 0–40)

## 2014-04-27 LAB — COMPREHENSIVE METABOLIC PANEL
ALT: 14 U/L (ref 0–35)
AST: 18 U/L (ref 0–37)
Albumin: 4.5 g/dL (ref 3.5–5.2)
Alkaline Phosphatase: 49 U/L (ref 39–117)
BILIRUBIN TOTAL: 0.5 mg/dL (ref 0.2–1.2)
BUN: 13 mg/dL (ref 6–23)
CALCIUM: 9.6 mg/dL (ref 8.4–10.5)
CO2: 26 meq/L (ref 19–32)
Chloride: 105 mEq/L (ref 96–112)
Creat: 0.81 mg/dL (ref 0.50–1.10)
Glucose, Bld: 91 mg/dL (ref 70–99)
Potassium: 4.4 mEq/L (ref 3.5–5.3)
Sodium: 138 mEq/L (ref 135–145)
Total Protein: 6.9 g/dL (ref 6.0–8.3)

## 2014-04-27 LAB — TSH: TSH: 2.32 u[IU]/mL (ref 0.350–4.500)

## 2014-04-27 MED ORDER — IBUPROFEN 600 MG PO TABS: 600.0000 mg | ORAL_TABLET | Freq: Three times a day (TID) | ORAL | Status: DC | PRN

## 2014-04-27 MED ORDER — ESCITALOPRAM OXALATE 10 MG PO TABS: ORAL_TABLET | ORAL | Status: DC

## 2014-04-27 NOTE — Progress Notes (Signed)
SHATAVIA SANTOR 08-10-1966 [id number]    History:    Presents for annual exam.  Regular monthly cycle for 5 days, 3 days heavy changing every 2 hours super plus tampons. Not sexually active for approximately 5 years. HSV rare outbreaks. 1996 LEEP, 2010 LGSIL with negative colposcopy. Normal mammogram history. History of hemochromatosis.  Past medical history, past surgical history, family history and social history were all reviewed and documented in the EPIC chart. [place]ie 12 doing well. 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 Mother hypertension, brother alcoholic.  ROS:  A  12 point ROS was performed and pertinent positives and negatives are included.  Exam:  Filed Vitals:   04/27/14 0941  BP: 116/72    General appearance:  Normal Thyroid:  Symmetrical, normal in size, without palpable masses or nodularity. Respiratory  Auscultation:  Clear without wheezing or rhonchi Cardiovascular  Auscultation:  Regular rate, without rubs, murmurs or gallops  Edema/varicosities:  Not grossly evident Abdominal  Soft,nontender, without masses, guarding or rebound.  Liver/spleen:  No organomegaly noted  Hernia:  None appreciated  Skin  Inspection:  Grossly normal   Breasts: Examined lying and sitting.     Right: Without masses, retractions, discharge or axillary adenopathy.     Left: Without masses, retractions, discharge or axillary adenopathy. Gentitourinary   Inguinal/mons:  Normal without inguinal adenopathy  External genitalia:  Normal  BUS/Urethra/Skene's glands:  Normal  Vagina:  Normal  Cervix:  Normal  Uterus:   normal in size, shape and contour.  Midline and mobile  Adnexa/parametria:     Rt: Without masses or tenderness.   Lt: Without masses or tenderness.  Anus and perineum: Normal  Digital rectal exam: Normal sphincter tone without palpated masses or tenderness  Assessment/Plan:  48 y.o. Seperated  WF G1P1 for annual exam.     Menorrhagia 96 LEEP, 2010 LGSIL with negative colposcopy and biopsy History of hemochromatosis Anxiety/depression-stable on Lexapro  Plan: SBE's, continue annual mammogram, 3-D tomography reviewed and encouraged history of dense breast. Regular exercise, calcium rich diet, vitamin D 1000 daily encouraged. Lexapro 10 mg daily prescription, proper use given and reviewed, denies need for counseling. Condoms encouraged if sexually active. Options for menorrhagia reviewed, declines ablation, Motrin 600 every 8 hours, will try Motrin with cycles. Menopause reviewed. CBC, lipid panel, comprehensive metabolic panel, TSH, UA, Pap normal 2014, new screening guidelines reviewed.  Note: This dictation was prepared with Dragon/digital dictation.  Any transcriptional errors that result are unintentional. Huel Cote [facility], 12:53 PM 04/27/2014

## 2014-04-27 NOTE — Patient Instructions (Signed)

## 2014-04-28 ENCOUNTER — Telehealth: Payer: Self-pay

## 2014-04-28 LAB — URINALYSIS W MICROSCOPIC + REFLEX CULTURE
Bacteria, UA: NONE SEEN
Bilirubin Urine: NEGATIVE
CASTS: NONE SEEN
CRYSTALS: NONE SEEN
Glucose, UA: NEGATIVE mg/dL
HGB URINE DIPSTICK: NEGATIVE
Ketones, ur: NEGATIVE mg/dL
LEUKOCYTES UA: NEGATIVE
NITRITE: NEGATIVE
PH: 5 (ref 5.0–8.0)
Protein, ur: NEGATIVE mg/dL
SPECIFIC GRAVITY, URINE: 1.02 (ref 1.005–1.030)
Urobilinogen, UA: 0.2 mg/dL (ref 0.0–1.0)

## 2014-04-28 NOTE — Telephone Encounter (Signed)
Message copied by Ramond Craver on Wed Apr 28, 2014  3:55 PM ------      Message from: [place], [place]      Created: Wed Apr 28, 2014  7:13 AM       Please call and review H&H is low, increase iron rich foods in diet, add a woman's One-A-Day vitamin that has iron in it daily. Lipid panel okay, continue to work on increasing regular exercise, less than 20 g saturated fat daily. ------

## 2014-04-28 NOTE — Telephone Encounter (Signed)
Patient was informed of results below.  She was very baffled by the low H&H.  She said she has been diagnosed with a hereditary condition (Famililal Hemachomotosis) where she has too much iron in her body and has to go periodically for "blood letting".  She was somewhat concerned. I told her I would check with you.

## 2014-04-29 ENCOUNTER — Other Ambulatory Visit: Payer: Self-pay | Admitting: Women's Health

## 2014-04-29 DIAGNOSIS — D509 Iron deficiency anemia, unspecified: Secondary | ICD-10-CM

## 2014-04-29 NOTE — Telephone Encounter (Signed)
Telephone call to review labs, Ferritin-level that was on the low end of normal 2013, has donated blood 2 weeks  earlier which may account for the lower hemoglobin and hematocrit. Did not have low hemoglobin and hematocrit at blood draw for donation. Will schedule screening colonoscopy. Recheck CBC in 3 months.

## 2014-04-29 NOTE — Telephone Encounter (Signed)
Order placed in system for CBC.

## 2014-04-30 LAB — CYTOLOGY - PAP

## 2014-06-28 ENCOUNTER — Encounter: Payer: Self-pay | Admitting: Women's Health

## 2014-08-09 ENCOUNTER — Other Ambulatory Visit: Payer: Self-pay

## 2014-08-09 DIAGNOSIS — F411 Generalized anxiety disorder: Secondary | ICD-10-CM

## 2014-08-09 MED ORDER — ESCITALOPRAM OXALATE 10 MG PO TABS: ORAL_TABLET | ORAL | Status: DC

## 2014-08-11 ENCOUNTER — Other Ambulatory Visit: Payer: Self-pay

## 2014-08-11 DIAGNOSIS — F411 Generalized anxiety disorder: Secondary | ICD-10-CM

## 2014-08-11 MED ORDER — ESCITALOPRAM OXALATE 10 MG PO TABS: ORAL_TABLET | ORAL | Status: DC

## 2014-10-05 ENCOUNTER — Telehealth: Payer: Self-pay | Admitting: *Deleted

## 2014-10-05 NOTE — Telephone Encounter (Signed)
Pt called requesting STD panel with HIV, pt transferred to front desk to schedule OV.

## 2014-10-12 ENCOUNTER — Encounter: Payer: Self-pay | Admitting: Women's Health

## 2014-10-12 ENCOUNTER — Ambulatory Visit (INDEPENDENT_AMBULATORY_CARE_PROVIDER_SITE_OTHER): Payer: 59 | Admitting: Women's Health

## 2014-10-12 DIAGNOSIS — B9689 Other specified bacterial agents as the cause of diseases classified elsewhere: Secondary | ICD-10-CM

## 2014-10-12 DIAGNOSIS — N760 Acute vaginitis: Secondary | ICD-10-CM

## 2014-10-12 DIAGNOSIS — Z113 Encounter for screening for infections with a predominantly sexual mode of transmission: Secondary | ICD-10-CM

## 2014-10-12 DIAGNOSIS — R21 Rash and other nonspecific skin eruption: Secondary | ICD-10-CM

## 2014-10-12 DIAGNOSIS — A499 Bacterial infection, unspecified: Secondary | ICD-10-CM

## 2014-10-12 LAB — WET PREP FOR TRICH, YEAST, CLUE
Clue Cells Wet Prep HPF POC: NONE SEEN
Trich, Wet Prep: NONE SEEN
Yeast Wet Prep HPF POC: NONE SEEN

## 2014-10-12 MED ORDER — BETAMETHASONE VALERATE 0.1 % EX OINT: 1.0000 "application " | TOPICAL_OINTMENT | Freq: Two times a day (BID) | CUTANEOUS | Status: DC

## 2014-10-12 MED ORDER — METRONIDAZOLE 0.75 % VA GEL: VAGINAL | Status: DC

## 2014-10-12 NOTE — Progress Notes (Signed)
Patient ID: Tina Allen, female   DOB: September 15, 1965, 49 y.o.   MRN: [id number] Presents with complaint of increased vaginal discharge and requests  STD screen. Currently not sexually active, past partner unfaithful. Denies abdominal pain, fever or urinary symptoms. Monthly cycle. Complains of itchy rash on sternum, minimal relief with Benadryl.  Exam: Appears well. External genitalia dark pigmentation at introitus with no change. Speculum exam scant white discharge with odor, wet prep negative. GC/Chlamydia culture taken. Bimanual no CMT or adnexal fullness or tenderness. Pinpoint raised red rash on sternum.  Clinical bacteria vaginosis STD screen  Plan: MetroGel vaginal cream 1 applicator at bedtime 5, alcohol precautions reviewed. GC/Chlamydia culture pending, HIV, hep B, C, RPR. Valisone 0.1% to rash on sternum, small amount twice daily, dermatologist if no relief.

## 2014-10-12 NOTE — Patient Instructions (Signed)
Bacterial Vaginosis Bacterial vaginosis is an infection of the vagina. It happens when too many of certain germs (bacteria) grow in the vagina. HOME CARE  Take your medicine as told by your doctor.  Finish your medicine even if you start to feel better.  Do not have sex until you finish your medicine and are better.  Tell your sex partner that you have an infection. They should see their doctor for treatment.  Practice safe sex. Use condoms. Have only one sex partner. GET HELP IF:  You are not getting better after 3 days of treatment.  You have more grey fluid (discharge) coming from your vagina than before.  You have more pain than before.  You have a fever. MAKE SURE YOU:   Understand these instructions.  Will watch your condition.  Will get help right away if you are not doing well or get worse. Document Released: 05/22/2008 Document Revised: 06/03/2013 Document Reviewed: 03/25/2013 [employer] Patient Information 2015 [employer]. This information is not intended to replace advice given to you by your health care provider. Make sure you discuss any questions you have with your health care provider.  

## 2014-10-13 LAB — HEPATITIS C ANTIBODY: HCV Ab: NEGATIVE

## 2014-10-13 LAB — HEPATITIS B SURFACE ANTIGEN: Hepatitis B Surface Ag: NEGATIVE

## 2014-10-13 LAB — RPR

## 2014-10-13 LAB — GC/CHLAMYDIA PROBE AMP
CT Probe RNA: NEGATIVE
GC PROBE AMP APTIMA: NEGATIVE

## 2014-10-13 LAB — HIV ANTIBODY (ROUTINE TESTING W REFLEX): HIV 1&2 Ab, 4th Generation: NONREACTIVE

## 2015-02-22 ENCOUNTER — Other Ambulatory Visit: Payer: Self-pay | Admitting: Family Medicine

## 2015-02-22 DIAGNOSIS — R1013 Epigastric pain: Secondary | ICD-10-CM

## 2015-03-08 ENCOUNTER — Ambulatory Visit
Admission: RE | Admit: 2015-03-08 | Discharge: 2015-03-08 | Disposition: A | Discharge status: Home / Self Care | Payer: 59 | Source: Ambulatory Visit | Attending: Family Medicine | Admitting: Family Medicine

## 2015-03-08 DIAGNOSIS — R1013 Epigastric pain: Secondary | ICD-10-CM

## 2015-05-04 ENCOUNTER — Encounter: Payer: BC Managed Care – PPO | Admitting: Women's Health

## 2015-05-10 ENCOUNTER — Ambulatory Visit (INDEPENDENT_AMBULATORY_CARE_PROVIDER_SITE_OTHER): Payer: 59 | Admitting: Women's Health

## 2015-05-10 ENCOUNTER — Encounter: Payer: Self-pay | Admitting: Women's Health

## 2015-05-10 VITALS — BP 118/78 | Ht 65.0 in | Wt 151.0 lb

## 2015-05-10 DIAGNOSIS — Z01419 Encounter for gynecological examination (general) (routine) without abnormal findings: Secondary | ICD-10-CM | POA: Diagnosis not present

## 2015-05-10 DIAGNOSIS — F411 Generalized anxiety disorder: Secondary | ICD-10-CM | POA: Diagnosis not present

## 2015-05-10 LAB — CBC WITH DIFFERENTIAL/PLATELET
BASOS ABS: 0.1 10*3/uL (ref 0.0–0.1)
Basophils Relative: 1 % (ref 0–1)
Eosinophils Absolute: 0.1 10*3/uL (ref 0.0–0.7)
Eosinophils Relative: 2 % (ref 0–5)
HEMATOCRIT: 38.6 % (ref 36.0–46.0)
Hemoglobin: 12.9 g/dL (ref 12.0–15.0)
LYMPHS PCT: 34 % (ref 12–46)
Lymphs Abs: 1.9 10*3/uL (ref 0.7–4.0)
MCH: 31 pg (ref 26.0–34.0)
MCHC: 33.4 g/dL (ref 30.0–36.0)
MCV: 92.8 fL (ref 78.0–100.0)
MONO ABS: 0.4 10*3/uL (ref 0.1–1.0)
MPV: 9.6 fL (ref 8.6–12.4)
Monocytes Relative: 7 % (ref 3–12)
NEUTROS ABS: 3.2 10*3/uL (ref 1.7–7.7)
Neutrophils Relative %: 56 % (ref 43–77)
Platelets: 224 10*3/uL (ref 150–400)
RBC: 4.16 MIL/uL (ref 3.87–5.11)
RDW: 16.9 % — ABNORMAL HIGH (ref 11.5–15.5)
WBC: 5.7 10*3/uL (ref 4.0–10.5)

## 2015-05-10 LAB — COMPREHENSIVE METABOLIC PANEL
ALK PHOS: 47 U/L (ref 33–130)
ALT: 15 U/L (ref 6–29)
AST: 16 U/L (ref 10–35)
Albumin: 3.9 g/dL (ref 3.6–5.1)
BILIRUBIN TOTAL: 0.5 mg/dL (ref 0.2–1.2)
BUN: 9 mg/dL (ref 7–25)
CALCIUM: 8.8 mg/dL (ref 8.6–10.4)
CO2: 25 mmol/L (ref 20–31)
Chloride: 107 mmol/L (ref 98–110)
Creat: 0.78 mg/dL (ref 0.50–1.05)
Glucose, Bld: 75 mg/dL (ref 65–99)
Potassium: 4.3 mmol/L (ref 3.5–5.3)
Sodium: 142 mmol/L (ref 135–146)
Total Protein: 6.1 g/dL (ref 6.1–8.1)

## 2015-05-10 LAB — LIPID PANEL
Cholesterol: 174 mg/dL (ref 125–200)
HDL: 47 mg/dL (ref >=46)
LDL Cholesterol: 102 mg/dL (ref <130)
TRIGLYCERIDES: 125 mg/dL (ref <150)
Total CHOL/HDL Ratio: 3.7 Ratio (ref <=5.0)
VLDL: 25 mg/dL (ref <30)

## 2015-05-10 MED ORDER — ESCITALOPRAM OXALATE 10 MG PO TABS: ORAL_TABLET | ORAL | Status: DC

## 2015-05-10 NOTE — Patient Instructions (Signed)
Health Maintenance Adopting a healthy lifestyle and getting preventive care can go a long way to promote health and wellness. Talk with your health care provider about what schedule of regular examinations is right for you. This is a good chance for you to check in with your provider about disease prevention and staying healthy. In between checkups, there are plenty of things you can do on your own. Experts have done a lot of research about which lifestyle changes and preventive measures are most likely to keep you healthy. Ask your health care provider for more information. WEIGHT AND DIET  Eat a healthy diet  Be sure to include plenty of vegetables, fruits, low-fat dairy products, and lean protein.  Do not eat a lot of foods high in solid fats, added sugars, or salt.  Get regular exercise. This is one of the most important things you can do for your health.  Most adults should exercise for at least 150 minutes each week. The exercise should increase your heart rate and make you sweat (moderate-intensity exercise).  Most adults should also do strengthening exercises at least twice a week. This is in addition to the moderate-intensity exercise.  Maintain a healthy weight  Body mass index (BMI) is a measurement that can be used to identify possible weight problems. It estimates body fat based on height and weight. Your health care provider can help determine your BMI and help you achieve or maintain a healthy weight.  For females 25 years of age and older:   A BMI below 18.5 is considered underweight.  A BMI of 18.5 to 24.9 is normal.  A BMI of 25 to 29.9 is considered overweight.  A BMI of 30 and above is considered obese.  Watch levels of cholesterol and blood lipids  You should start having your blood tested for lipids and cholesterol at 51 years of age, then have this test every 5 years.  You may need to have your cholesterol levels checked more often if:  Your lipid or  cholesterol levels are high.  You are older than 51 years of age.  You are at high risk for heart disease.  CANCER SCREENING   Lung Cancer  Lung cancer screening is recommended for adults 97-92 years old who are at high risk for lung cancer because of a history of smoking.  A yearly low-dose CT scan of the lungs is recommended for people who:  Currently smoke.  Have quit within the past 15 years.  Have at least a 30-pack-year history of smoking. A pack year is smoking an average of one pack of cigarettes a day for 1 year.  Yearly screening should continue until it has been 15 years since you quit.  Yearly screening should stop if you develop a health problem that would prevent you from having lung cancer treatment.  Breast Cancer  Practice breast self-awareness. This means understanding how your breasts normally appear and feel.  It also means doing regular breast self-exams. Let your health care provider know about any changes, no matter how small.  If you are in your 20s or 30s, you should have a clinical breast exam (CBE) by a health care provider every 1-3 years as part of a regular health exam.  If you are 76 or older, have a CBE every year. Also consider having a breast X-ray (mammogram) every year.  If you have a family history of breast cancer, talk to your health care provider about genetic screening.  If you are  at high risk for breast cancer, talk to your health care provider about having an MRI and a mammogram every year.  Breast cancer gene (BRCA) assessment is recommended for women who have family members with BRCA-related cancers. BRCA-related cancers include:  Breast.  Ovarian.  Tubal.  Peritoneal cancers.  Results of the assessment will determine the need for genetic counseling and BRCA1 and BRCA2 testing. Cervical Cancer Routine pelvic examinations to screen for cervical cancer are no longer recommended for nonpregnant women who are considered low  risk for cancer of the pelvic organs (ovaries, uterus, and vagina) and who do not have symptoms. A pelvic examination may be necessary if you have symptoms including those associated with pelvic infections. Ask your health care provider if a screening pelvic exam is right for you.   The Pap test is the screening test for cervical cancer for women who are considered at risk.  If you had a hysterectomy for a problem that was not cancer or a condition that could lead to cancer, then you no longer need Pap tests.  If you are older than 65 years, and you have had normal Pap tests for the past 10 years, you no longer need to have Pap tests.  If you have had past treatment for cervical cancer or a condition that could lead to cancer, you need Pap tests and screening for cancer for at least 20 years after your treatment.  If you no longer get a Pap test, assess your risk factors if they change (such as having a new sexual partner). This can affect whether you should start being screened again.  Some women have medical problems that increase their chance of getting cervical cancer. If this is the case for you, your health care provider may recommend more frequent screening and Pap tests.  The human papillomavirus (HPV) test is another test that may be used for cervical cancer screening. The HPV test looks for the virus that can cause cell changes in the cervix. The cells collected during the Pap test can be tested for HPV.  The HPV test can be used to screen women 30 years of age and older. Getting tested for HPV can extend the interval between normal Pap tests from three to five years.  An HPV test also should be used to screen women of any age who have unclear Pap test results.  After 51 years of age, women should have HPV testing as often as Pap tests.  Colorectal Cancer  This type of cancer can be detected and often prevented.  Routine colorectal cancer screening usually begins at 50 years of  age and continues through 51 years of age.  Your health care provider may recommend screening at an earlier age if you have risk factors for colon cancer.  Your health care provider may also recommend using home test kits to check for hidden blood in the stool.  A small camera at the end of a tube can be used to examine your colon directly (sigmoidoscopy or colonoscopy). This is done to check for the earliest forms of colorectal cancer.  Routine screening usually begins at age 50.  Direct examination of the colon should be repeated every 5-10 years through 51 years of age. However, you may need to be screened more often if early forms of precancerous polyps or small growths are found. Skin Cancer  Check your skin from head to toe regularly.  Tell your health care provider about any new moles or changes in   moles, especially if there is a change in a mole's shape or color.  Also tell your health care provider if you have a mole that is larger than the size of a pencil eraser.  Always use sunscreen. Apply sunscreen liberally and repeatedly throughout the day.  Protect yourself by wearing long sleeves, pants, a wide-brimmed hat, and sunglasses whenever you are outside. HEART DISEASE, DIABETES, AND HIGH BLOOD PRESSURE   Have your blood pressure checked at least every 1-2 years. High blood pressure causes heart disease and increases the risk of stroke.  If you are between 75 years and 42 years old, ask your health care provider if you should take aspirin to prevent strokes.  Have regular diabetes screenings. This involves taking a blood sample to check your fasting blood sugar level.  If you are at a normal weight and have a low risk for diabetes, have this test once every three years after 51 years of age.  If you are overweight and have a high risk for diabetes, consider being tested at a younger age or more often. PREVENTING INFECTION  Hepatitis B  If you have a higher risk for  hepatitis B, you should be screened for this virus. You are considered at high risk for hepatitis B if:  You were born in a country where hepatitis B is common. Ask your health care provider which countries are considered high risk.  Your parents were born in a high-risk country, and you have not been immunized against hepatitis B (hepatitis B vaccine).  You have HIV or AIDS.  You use needles to inject street drugs.  You live with someone who has hepatitis B.  You have had sex with someone who has hepatitis B.  You get hemodialysis treatment.  You take certain medicines for conditions, including cancer, organ transplantation, and autoimmune conditions. Hepatitis C  Blood testing is recommended for:  Everyone born from [birth year] through 1967.  Anyone with known risk factors for hepatitis C. Sexually transmitted infections (STIs)  You should be screened for sexually transmitted infections (STIs) including gonorrhea and chlamydia if:  You are sexually active and are younger than 51 years of age.  You are older than 51 years of age and your health care provider tells you that you are at risk for this type of infection.  Your sexual activity has changed since you were last screened and you are at an increased risk for chlamydia or gonorrhea. Ask your health care provider if you are at risk.  If you do not have HIV, but are at risk, it may be recommended that you take a prescription medicine daily to prevent HIV infection. This is called pre-exposure prophylaxis (PrEP). You are considered at risk if:  You are sexually active and do not regularly use condoms or know the HIV status of your partner(s).  You take drugs by injection.  You are sexually active with a partner who has HIV. Talk with your health care provider about whether you are at high risk of being infected with HIV. If you choose to begin PrEP, you should first be tested for HIV. You should then be tested every 3 months for  as long as you are taking PrEP.  PREGNANCY   If you are premenopausal and you may become pregnant, ask your health care provider about preconception counseling.  If you may become pregnant, take 400 to 800 micrograms (mcg) of folic acid every day.  If you want to prevent pregnancy, talk to your  health care provider about birth control (contraception). OSTEOPOROSIS AND MENOPAUSE   Osteoporosis is a disease in which the bones lose minerals and strength with aging. This can result in serious bone fractures. Your risk for osteoporosis can be identified using a bone density scan.  If you are 45 years of age or older, or if you are at risk for osteoporosis and fractures, ask your health care provider if you should be screened.  Ask your health care provider whether you should take a calcium or vitamin D supplement to lower your risk for osteoporosis.  Menopause may have certain physical symptoms and risks.  Hormone replacement therapy may reduce some of these symptoms and risks. Talk to your health care provider about whether hormone replacement therapy is right for you.  HOME CARE INSTRUCTIONS   Schedule regular health, dental, and eye exams.  Stay current with your immunizations.   Do not use any tobacco products including cigarettes, chewing tobacco, or electronic cigarettes.  If you are pregnant, do not drink alcohol.  If you are breastfeeding, limit how much and how often you drink alcohol.  Limit alcohol intake to no more than 1 drink per day for nonpregnant women. One drink equals 12 ounces of beer, 5 ounces of wine, or 1 ounces of hard liquor.  Do not use street drugs.  Do not share needles.  Ask your health care provider for help if you need support or information about quitting drugs.  Tell your health care provider if you often feel depressed.  Tell your health care provider if you have ever been abused or do not feel safe at home. Document Released: 02/26/2011  Document Revised: 12/28/2013 Document Reviewed: 07/15/2013 [facility] Patient Information 2015 [place], [place]. This information is not intended to replace advice given to you by your health care provider. Make sure you discuss any questions you have with your health care provider. Cholecystitis Cholecystitis is an inflammation of your gallbladder. It is usually caused by a buildup of gallstones or sludge (cholelithiasis) in your gallbladder. The gallbladder stores a fluid that helps digest fats (bile). Cholecystitis is serious and needs treatment right away.  CAUSES   Gallstones. Gallstones can block the tube that leads to your gallbladder, causing bile to build up. As bile builds up, the gallbladder becomes inflamed.  Bile duct problems, such as blockage from scarring or kinking.  Tumors. Tumors can stop bile from leaving your gallbladder correctly, causing bile to build up. As bile builds up, the gallbladder becomes inflamed. SYMPTOMS   Nausea.  Vomiting.  Abdominal pain, especially in the upper right area of your abdomen.  Abdominal tenderness or bloating.  Sweating.  Chills.  Fever.  Yellowing of the skin and the whites of the eyes (jaundice). DIAGNOSIS  Your caregiver may order blood tests to look for infection or gallbladder problems. Your caregiver may also order imaging tests, such as an ultrasound or computed tomography (CT) scan. Further tests may include a hepatobiliary iminodiacetic acid (HIDA) scan. This scan allows your caregiver to see your bile move from the liver to the gallbladder and to the small intestine. TREATMENT  A hospital stay is usually necessary to lessen the inflammation of your gallbladder. You may be required to not eat or drink (fast) for a certain amount of time. You may be given medicine to treat pain or an antibiotic medicine to treat an infection. Surgery may be needed to remove your gallbladder (cholecystectomy) once the inflammation has gone down.  Surgery may be needed right  away if you develop complications such as death of gallbladder tissue (gangrene) or a tear (perforation) of the gallbladder.  Tower care will depend on your treatment. In general:  If you were given antibiotics, take them as directed. Finish them even if you start to feel better.  Only take over-the-counter or prescription medicines for pain, discomfort, or fever as directed by your caregiver.  Follow a low-fat diet until you see your caregiver again.  Keep all follow-up visits as directed by your caregiver. SEEK IMMEDIATE MEDICAL CARE IF:   Your pain is increasing and not controlled by medicines.  Your pain moves to another part of your abdomen or to your back.  You have a fever.  You have nausea and vomiting. MAKE SURE YOU:  Understand these instructions.  Will watch your condition.  Will get help right away if you are not doing well or get worse. Document Released: 08/13/2005 Document Revised: 11/05/2011 Document Reviewed: 06/29/2011 [facility] Patient Information 2015 [place], [place]. This information is not intended to replace advice given to you by your health care provider. Make sure you discuss any questions you have with your health care provider.

## 2015-05-10 NOTE — Progress Notes (Signed)
AKEEMA BRODER 24-Mar-1966 [id number]    History:    Presents for annual exam.  Continues to have regular monthly 5 day cycles. Not sexually active. 1996 LEEP, 2010 LGSIL with normal/negative colposcopy and biopsy. Normal mammogram history. Has not had a colonoscopy. History of hemachromatosis last CBC slight anemia. HSV rare outbreaks.  Past medical history, past surgical history, family history and social history were all reviewed and documented in the EPIC chart. 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 [place] 11 doing well.  ROS:  A ROS was performed and pertinent positives and negatives are included.  Exam:  Filed Vitals:   05/10/15 0918  BP: 118/78    General appearance:  Normal Thyroid:  Symmetrical, normal in size, without palpable masses or nodularity. Respiratory  Auscultation:  Clear without wheezing or rhonchi Cardiovascular  Auscultation:  Regular rate, without rubs, murmurs or gallops  Edema/varicosities:  Not grossly evident Abdominal  Soft,nontender, without masses, guarding or rebound.  Liver/spleen:  No organomegaly noted  Hernia:  None appreciated  Skin  Inspection:  Grossly normal   Breasts: Examined lying and sitting.     Right: Without masses, retractions, discharge or axillary adenopathy.     Left: Without masses, retractions, discharge or axillary adenopathy. Gentitourinary   Inguinal/mons:  Normal without inguinal adenopathy  External genitalia:  Normal, dark pigmentation at introitus-always  BUS/Urethra/Skene's glands:  Normal  Vagina:  Normal  Cervix:  Normal  Uterus:  normal in size, shape and contour.  Midline and mobile  Adnexa/parametria:     Rt: Without masses or tenderness.   Lt: Without masses or tenderness.  Anus and perineum: Normal  Digital rectal exam: Normal sphincter tone without palpated masses or tenderness  Assessment/Plan:  49 y.o. separated WF G1 P1 for annual exam.     Monthly 5 day cycle/not sexually active History of hemachromatosis HSV rare outbreaks Anxiety/depression stable on Lexapro Gallbladder disease-primary care managing referral  Plan: Importance of screening colonoscopy reviewed, instructed to schedule, Dr. Collene Mares information given or [facility]. SBE's, annual screening mammogram overdue, instructed to schedule. Exercise, calcium rich diet, vitamin D 1000 daily encouraged. Has been taking an iron supplement for several months. Having issues with diarrhea, right upper abdominal pain gallstones noted on ultrasound, at this point trying to avoid surgery. Lexapro 20 mg by mouth daily prescription, proper use given and reviewed. Reviewed importance of increasing leisure activities, exercise. Counseling in the past, declines need at this time. Condoms encouraged if sexually active. Denies menopausal symptoms at this time. CBC, lipid panel, CMP, UA, Pap normal with negative HR HPV 2015, new screening guidelines reviewed.   Huel Cote [facility], 9:54 AM 05/10/2015

## 2015-05-11 LAB — URINALYSIS W MICROSCOPIC + REFLEX CULTURE
BILIRUBIN URINE: NEGATIVE
Bacteria, UA: NONE SEEN [HPF]
CRYSTALS: NONE SEEN [HPF]
Casts: NONE SEEN [LPF]
Glucose, UA: NEGATIVE
Hgb urine dipstick: NEGATIVE
Ketones, ur: NEGATIVE
LEUKOCYTES UA: NEGATIVE
Nitrite: NEGATIVE
Protein, ur: NEGATIVE
RBC / HPF: NONE SEEN RBC/HPF (ref <=2)
Specific Gravity, Urine: 1.026 (ref 1.001–1.035)
Yeast: NONE SEEN [HPF]
pH: 5.5 (ref 5.0–8.0)

## 2015-05-12 LAB — URINE CULTURE
Colony Count: NO GROWTH
ORGANISM ID, BACTERIA: NO GROWTH

## 2015-06-27 DIAGNOSIS — K8020 Calculus of gallbladder without cholecystitis without obstruction: Secondary | ICD-10-CM | POA: Insufficient documentation

## 2015-06-28 ENCOUNTER — Other Ambulatory Visit: Payer: Self-pay | Admitting: Nurse Practitioner

## 2015-06-28 DIAGNOSIS — K8020 Calculus of gallbladder without cholecystitis without obstruction: Secondary | ICD-10-CM

## 2015-07-05 ENCOUNTER — Encounter
Admission: RE | Admit: 2015-07-05 | Discharge: 2015-07-05 | Disposition: A | Discharge status: Home / Self Care | Payer: 59 | Source: Ambulatory Visit | Attending: Nurse Practitioner | Admitting: Nurse Practitioner

## 2015-07-05 DIAGNOSIS — K8020 Calculus of gallbladder without cholecystitis without obstruction: Secondary | ICD-10-CM | POA: Diagnosis not present

## 2015-07-05 MED ORDER — SINCALIDE 5 MCG IJ SOLR
0.0200 ug/kg | Freq: Once | INTRAMUSCULAR | Status: AC
  Administered 2015-07-05: 1.37 ug via INTRAVENOUS

## 2015-07-05 MED ORDER — TECHNETIUM TC 99M MEBROFENIN IV KIT
5.2200 | PACK | Freq: Once | INTRAVENOUS | Status: DC | PRN
  Administered 2015-07-05: 5.22 via INTRAVENOUS

## 2015-07-26 LAB — HM COLONOSCOPY

## 2015-08-03 ENCOUNTER — Encounter: Payer: Self-pay | Admitting: Women's Health

## 2015-08-15 DIAGNOSIS — K209 Esophagitis, unspecified without bleeding: Secondary | ICD-10-CM | POA: Insufficient documentation

## 2016-01-20 DIAGNOSIS — L853 Xerosis cutis: Secondary | ICD-10-CM | POA: Diagnosis not present

## 2016-01-20 DIAGNOSIS — L299 Pruritus, unspecified: Secondary | ICD-10-CM | POA: Diagnosis not present

## 2016-01-20 DIAGNOSIS — L304 Erythema intertrigo: Secondary | ICD-10-CM | POA: Diagnosis not present

## 2016-03-16 ENCOUNTER — Encounter: Payer: Self-pay | Admitting: Women's Health

## 2016-03-16 ENCOUNTER — Ambulatory Visit (INDEPENDENT_AMBULATORY_CARE_PROVIDER_SITE_OTHER): Payer: BLUE CROSS/BLUE SHIELD | Admitting: Women's Health

## 2016-03-16 VITALS — BP 124/74

## 2016-03-16 DIAGNOSIS — N920 Excessive and frequent menstruation with regular cycle: Secondary | ICD-10-CM | POA: Diagnosis not present

## 2016-03-16 DIAGNOSIS — F329 Major depressive disorder, single episode, unspecified: Secondary | ICD-10-CM

## 2016-03-16 DIAGNOSIS — F32A Depression, unspecified: Secondary | ICD-10-CM

## 2016-03-16 MED ORDER — SERTRALINE HCL 50 MG PO TABS: 50.0000 mg | ORAL_TABLET | Freq: Every day | ORAL | Status: DC

## 2016-03-16 MED ORDER — TRANEXAMIC ACID 650 MG PO TABS: 1300.0000 mg | ORAL_TABLET | Freq: Three times a day (TID) | ORAL | Status: DC

## 2016-03-16 NOTE — Patient Instructions (Addendum)
Levonorgestrel intrauterine device (IUD) What is this medicine? LEVONORGESTREL IUD (LEE voe nor jes trel) is a contraceptive (birth control) device. The device is placed inside the uterus by a healthcare professional. It is used to prevent pregnancy and can also be used to treat heavy bleeding that occurs during your period. Depending on the device, it can be used for 3 to 5 years. This medicine may be used for other purposes; ask your health care provider or pharmacist if you have questions. What should I tell my health care provider before I take this medicine? They need to know if you have any of these conditions: -abnormal Pap smear -cancer of the breast, uterus, or cervix -diabetes -endometritis -genital or pelvic infection now or in the past -have more than one sexual partner or your partner has more than one partner -heart disease -history of an ectopic or tubal pregnancy -immune system problems -IUD in place -liver disease or tumor -problems with blood clots or take blood-thinners -use intravenous drugs -uterus of unusual shape -vaginal bleeding that has not been explained -an unusual or allergic reaction to levonorgestrel, other hormones, silicone, or polyethylene, medicines, foods, dyes, or preservatives -pregnant or trying to get pregnant -breast-feeding How should I use this medicine? This device is placed inside the uterus by a health care professional. Talk to your pediatrician regarding the use of this medicine in children. Special care may be needed. Overdosage: If you think you have taken too much of this medicine contact a poison control center or emergency room at once. NOTE: This medicine is only for you. Do not share this medicine with others. What if I miss a dose? This does not apply. What may interact with this medicine? Do not take this medicine with any of the following medications: -amprenavir -bosentan -fosamprenavir This medicine may also interact with  the following medications: -aprepitant -barbiturate medicines for inducing sleep or treating seizures -bexarotene -griseofulvin -medicines to treat seizures like carbamazepine, ethotoin, felbamate, oxcarbazepine, phenytoin, topiramate -modafinil -pioglitazone -rifabutin -rifampin -rifapentine -some medicines to treat HIV infection like atazanavir, indinavir, lopinavir, nelfinavir, tipranavir, ritonavir -St. John's wort -warfarin This list may not describe all possible interactions. Give your health care provider a list of all the medicines, herbs, non-prescription drugs, or dietary supplements you use. Also tell them if you smoke, drink alcohol, or use illegal drugs. Some items may interact with your medicine. What should I watch for while using this medicine? Visit your doctor or health care professional for regular check ups. See your doctor if you or your partner has sexual contact with others, becomes HIV positive, or gets a sexual transmitted disease. This product does not protect you against HIV infection (AIDS) or other sexually transmitted diseases. You can check the placement of the IUD yourself by reaching up to the top of your vagina with clean fingers to feel the threads. Do not pull on the threads. It is a good habit to check placement after each menstrual period. Call your doctor right away if you feel more of the IUD than just the threads or if you cannot feel the threads at all. The IUD may come out by itself. You may become pregnant if the device comes out. If you notice that the IUD has come out use a backup birth control method like condoms and call your health care provider. Using tampons will not change the position of the IUD and are okay to use during your period. What side effects may I notice from receiving this medicine?   Side effects that you should report to your doctor or health care professional as soon as possible: -allergic reactions like skin rash, itching or  hives, swelling of the face, lips, or tongue -fever, flu-like symptoms -genital sores -high blood pressure -no menstrual period for 6 weeks during use -pain, swelling, warmth in the leg -pelvic pain or tenderness -severe or sudden headache -signs of pregnancy -stomach cramping -sudden shortness of breath -trouble with balance, talking, or walking -unusual vaginal bleeding, discharge -yellowing of the eyes or skin Side effects that usually do not require medical attention (report to your doctor or health care professional if they continue or are bothersome): -acne -breast pain -change in sex drive or performance -changes in weight -cramping, dizziness, or faintness while the device is being inserted -headache -irregular menstrual bleeding within first 3 to 6 months of use -nausea This list may not describe all possible side effects. Call your doctor for medical advice about side effects. You may report side effects to FDA at 1-800-FDA-1088. Where should I keep my medicine? This does not apply. NOTE: This sheet is a summary. It may not cover all possible information. If you have questions about this medicine, talk to your doctor, pharmacist, or health care provider.    2016, Elsevier/Gold Standard. (2011-09-13 13:54:04) Major Depressive Disorder Major depressive disorder is a mental illness. It also may be called clinical depression or unipolar depression. Major depressive disorder usually causes feelings of sadness, hopelessness, or helplessness. Some people with this disorder do not feel particularly sad but lose interest in doing things they used to enjoy (anhedonia). Major depressive disorder also can cause physical symptoms. It can interfere with work, school, relationships, and other normal everyday activities. The disorder varies in severity but is longer lasting and more serious than the sadness we all feel from time to time in our lives. Major depressive disorder often is  triggered by stressful life events or major life changes. Examples of these triggers include divorce, loss of your job or home, a move, and the death of a family member or close friend. Sometimes this disorder occurs for no obvious reason at all. People who have family members with major depressive disorder or bipolar disorder are at higher risk for developing this disorder, with or without life stressors. Major depressive disorder can occur at any age. It may occur just once in your life (single episode major depressive disorder). It may occur multiple times (recurrent major depressive disorder). SYMPTOMS People with major depressive disorder have either anhedonia or depressed mood on nearly a daily basis for at least 2 weeks or longer. Symptoms of depressed mood include:  Feelings of sadness (blue or down in the dumps) or emptiness.  Feelings of hopelessness or helplessness.  Tearfulness or episodes of crying (may be observed by others).  Irritability (children and adolescents). In addition to depressed mood or anhedonia or both, people with this disorder have at least four of the following symptoms:  Difficulty sleeping or sleeping too much.   Significant change (increase or decrease) in appetite or weight.   Lack of energy or motivation.  Feelings of guilt and worthlessness.   Difficulty concentrating, remembering, or making decisions.  Unusually slow movement (psychomotor retardation) or restlessness (as observed by others).   Recurrent wishes for death, recurrent thoughts of self-harm (suicide), or a suicide attempt. People with major depressive disorder commonly have persistent negative thoughts about themselves, other people, and the world. People with severe major depressive disorder may experiencedistorted beliefs or perceptions about  the world (psychotic delusions). They also may see or hear things that are not real (psychotic hallucinations). DIAGNOSIS Major depressive  disorder is diagnosed through an assessment by your health care provider. Your health care provider will ask aboutaspects of your daily life, such as mood,sleep, and appetite, to see if you have the diagnostic symptoms of major depressive disorder. Your health care provider may ask about your medical history and use of alcohol or drugs, including prescription medicines. Your health care provider also may do a physical exam and blood work. This is because certain medical conditions and the use of certain substances can cause major depressive disorder-like symptoms (secondary depression). Your health care provider also may refer you to a mental health specialist for further evaluation and treatment. TREATMENT It is important to recognize the symptoms of major depressive disorder and seek treatment. The following treatments can be prescribed for this disorder:   Medicine. Antidepressant medicines usually are prescribed. Antidepressant medicines are thought to correct chemical imbalances in the brain that are commonly associated with major depressive disorder. Other types of medicine may be added if the symptoms do not respond to antidepressant medicines alone or if psychotic delusions or hallucinations occur.  Talk therapy. Talk therapy can be helpful in treating major depressive disorder by providing support, education, and guidance. Certain types of talk therapy also can help with negative thinking (cognitive behavioral therapy) and with relationship issues that trigger this disorder (interpersonal therapy). A mental health specialist can help determine which treatment is best for you. Most people with major depressive disorder do well with a combination of medicine and talk therapy. Treatments involving electrical stimulation of the brain can be used in situations with extremely severe symptoms or when medicine and talk therapy do not work over time. These treatments include electroconvulsive therapy,  transcranial magnetic stimulation, and vagal nerve stimulation.   This information is not intended to replace advice given to you by your health care provider. Make sure you discuss any questions you have with your health care provider.   Document Released: 12/08/2012 Document Revised: 09/03/2014 Document Reviewed: 12/08/2012 Elsevier Interactive Patient Education [employer].

## 2016-03-16 NOTE — Progress Notes (Signed)
Patient ID: Tina Allen, female   DOB: 07-14-1966, 50 y.o.   MRN: [id number] Presents with several issues. Continues to have a monthly 5 day cycle, every 2-4 cycles she has a heavy cycle where she is changing tampons every 2 hours making it difficult to leave her home. Denies bleeding between cycles, abdominal pain, or any menopausal symptoms.  Having increased symptoms of depression, states did not like Lexapro did try it for a month but stopped last year. Has seen counselors in the past, denies any suicidal ideation. 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 Reports drinking alcohol several days a week.. Also states has a sore spot under right axilla for several days. Normal mammogram history. New partner negative STD screen. Reports drinking alcohol several days a week.  Exam: Appears well groomed, good eye contact, able to verbalize feelings well. Breast exam and sitting and lying position and visible retractions, dimpling, nipple discharge or palpable nodules, right axilla 1 cm palpable tender erythematous area . External genitalia within normal limits, speculum exam scant bleeding on tampon, no erythema, discharge with odor, cervix without visible lesion or polyp, bimanual no CMT or adnexal tenderness.  Probable inflamed right axilla hair follicle Depression Menorrhagia  Plan: Apply Neosporin, avoid deodorant for several days, call if no relief of tenderness. Options for depression and reviewed, would like to try an antidepressant, Zoloft 50 mg will start with half tablet daily for 1 week and increase to full tablet. Prescription, proper use given and reviewed. Reviewed importance of increasing leisure activities, daily regular exercise and avoiding alcohol, reviewed alcohol a depressant. Options for menorrhagia reviewed, would like to try Mirena IUD will check coverage, Dr. Toney Rakes to place with next cycle. Reviewed slight risk  of infection, perforation and hemorrhage. Prescription for Lysteda 2 tablets 3 times daily if needed given.

## 2016-03-20 ENCOUNTER — Telehealth: Payer: Self-pay | Admitting: Gynecology

## 2016-03-20 NOTE — Telephone Encounter (Signed)
03/20/16-I LM VM for pt to let her know that her [facility] will cover the Mirena for contraception(dx per [place]) at 100%, no copay. Pt advised JF to do and to call start of cycle if wishes to proceed. Per [email or web address] -Ref#[id number]

## 2016-05-10 ENCOUNTER — Encounter: Payer: 59 | Admitting: Women's Health

## 2016-06-18 DIAGNOSIS — L219 Seborrheic dermatitis, unspecified: Secondary | ICD-10-CM | POA: Diagnosis not present

## 2016-06-18 DIAGNOSIS — L299 Pruritus, unspecified: Secondary | ICD-10-CM | POA: Diagnosis not present

## 2016-07-12 ENCOUNTER — Other Ambulatory Visit: Payer: Self-pay

## 2016-07-12 MED ORDER — SERTRALINE HCL 50 MG PO TABS: 50.0000 mg | ORAL_TABLET | Freq: Every day | ORAL | 2 refills | Status: DC

## 2016-09-05 DIAGNOSIS — M9904 Segmental and somatic dysfunction of sacral region: Secondary | ICD-10-CM | POA: Diagnosis not present

## 2016-09-05 DIAGNOSIS — M461 Sacroiliitis, not elsewhere classified: Secondary | ICD-10-CM | POA: Diagnosis not present

## 2016-09-05 DIAGNOSIS — M545 Low back pain: Secondary | ICD-10-CM | POA: Diagnosis not present

## 2016-09-05 DIAGNOSIS — M9903 Segmental and somatic dysfunction of lumbar region: Secondary | ICD-10-CM | POA: Diagnosis not present

## 2016-09-13 ENCOUNTER — Encounter: Payer: BLUE CROSS/BLUE SHIELD | Admitting: Women's Health

## 2016-09-14 ENCOUNTER — Ambulatory Visit (INDEPENDENT_AMBULATORY_CARE_PROVIDER_SITE_OTHER): Payer: BLUE CROSS/BLUE SHIELD | Admitting: Women's Health

## 2016-09-14 ENCOUNTER — Encounter: Payer: Self-pay | Admitting: Women's Health

## 2016-09-14 VITALS — BP 128/86 | Ht 64.5 in | Wt 156.0 lb

## 2016-09-14 DIAGNOSIS — F411 Generalized anxiety disorder: Secondary | ICD-10-CM | POA: Diagnosis not present

## 2016-09-14 DIAGNOSIS — Z01419 Encounter for gynecological examination (general) (routine) without abnormal findings: Secondary | ICD-10-CM

## 2016-09-14 DIAGNOSIS — Z1151 Encounter for screening for human papillomavirus (HPV): Secondary | ICD-10-CM | POA: Diagnosis not present

## 2016-09-14 DIAGNOSIS — Z23 Encounter for immunization: Secondary | ICD-10-CM

## 2016-09-14 LAB — CBC WITH DIFFERENTIAL/PLATELET
BASOS PCT: 1 %
Basophils Absolute: 53 cells/uL (ref 0–200)
EOS PCT: 2 %
Eosinophils Absolute: 106 cells/uL (ref 15–500)
HCT: 36.3 % (ref 35.0–45.0)
Hemoglobin: 11.8 g/dL (ref 11.7–15.5)
LYMPHS ABS: 1908 {cells}/uL (ref 850–3900)
LYMPHS PCT: 36 %
MCH: 29.6 pg (ref 27.0–33.0)
MCHC: 32.5 g/dL (ref 32.0–36.0)
MCV: 91 fL (ref 80.0–100.0)
MONO ABS: 371 {cells}/uL (ref 200–950)
MPV: 10.2 fL (ref 7.5–12.5)
Monocytes Relative: 7 %
NEUTROS PCT: 54 %
Neutro Abs: 2862 cells/uL (ref 1500–7800)
Platelets: 226 10*3/uL (ref 140–400)
RBC: 3.99 MIL/uL (ref 3.80–5.10)
RDW: 14.3 % (ref 11.0–15.0)
WBC: 5.3 10*3/uL (ref 3.8–10.8)

## 2016-09-14 LAB — COMPREHENSIVE METABOLIC PANEL
ALK PHOS: 42 U/L (ref 33–130)
ALT: 11 U/L (ref 6–29)
AST: 14 U/L (ref 10–35)
Albumin: 3.9 g/dL (ref 3.6–5.1)
BILIRUBIN TOTAL: 0.2 mg/dL (ref 0.2–1.2)
BUN: 16 mg/dL (ref 7–25)
CO2: 23 mmol/L (ref 20–31)
CREATININE: 0.88 mg/dL (ref 0.50–1.05)
Calcium: 8.8 mg/dL (ref 8.6–10.4)
Chloride: 107 mmol/L (ref 98–110)
GLUCOSE: 84 mg/dL (ref 65–99)
Potassium: 4.2 mmol/L (ref 3.5–5.3)
SODIUM: 137 mmol/L (ref 135–146)
Total Protein: 6.5 g/dL (ref 6.1–8.1)

## 2016-09-14 MED ORDER — ALPRAZOLAM 0.25 MG PO TABS: 0.2500 mg | ORAL_TABLET | Freq: Every evening | ORAL | 1 refills | Status: DC | PRN

## 2016-09-14 NOTE — Patient Instructions (Signed)

## 2016-09-14 NOTE — Progress Notes (Signed)
Tina Allen 09/20/1965 [id number]    History:    Presents for annual exam. Regular monthly cycle/same partner. 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 Normal mammogram history. Colonoscopy 2017 with benign polyps has a 5 year follow-up colonoscopy done in [place]. History of hemachromatosis. Separated planning to move in the next few months has had increased anxiety. Zoloft in the past but did not like. Declines other antianxiety medication, has had counseling and denies need.   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 Tina Allen is 51 doing well has had gardasil.  ROS:  A ROS was performed and pertinent positives and negatives are included.  Exam:  Vitals:   09/14/16 1237  BP: 128/86  Weight: 156 lb (70.8 kg)  Height: 5' 4.5" (1.638 m)   Body mass index is 26.36 kg/m.   General appearance:  Normal Thyroid:  Symmetrical, normal in size, without palpable masses or nodularity. Respiratory  Auscultation:  Clear without wheezing or rhonchi Cardiovascular  Auscultation:  Regular rate, without rubs, murmurs or gallops  Edema/varicosities:  Not grossly evident Abdominal  Soft,nontender, without masses, guarding or rebound.  Liver/spleen:  No organomegaly noted  Hernia:  None appreciated  Skin  Inspection:  Grossly normal   Breasts: Examined lying and sitting.     Right: Without masses, retractions, discharge or axillary adenopathy.     Left: Without masses, retractions, discharge or axillary adenopathy. Gentitourinary   Inguinal/mons:  Normal without inguinal adenopathy  External genitalia:  Normal  BUS/Urethra/Skene's glands:  Normal  Vagina:  Normal  Cervix:  Normal  Uterus:  normal in size, shape and contour.  Midline and mobile  Adnexa/parametria:      Rt: Without masses or tenderness.   Lt: Without masses or tenderness.  Anus and perineum: Normal  Digital rectal exam: Normal sphincter tone without palpated masses or tenderness  Assessment/Plan:  51 y.o. separated WF G2 P1 for annual exam.     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 Options for anxiety reviewed would like when necessary Xanax prescription for Xanax 0.25 when necessary, reviewed addictive properties into use sparingly. SBE's, continue annual mammogram 3-D tomography reviewed and encouraged history of dense breasts. Exercise, calcium rich diet, vitamin D 1000 daily encouraged. CBC, CMP, UA, Pap with HR HPV typing, new screening guidelines reviewed. Normal lipid panel 07/2015.    [place], 1:34 PM 09/14/2016

## 2016-09-14 NOTE — Addendum Note (Signed)
Addended by: Thurnell Garbe A on: 09/14/2016 02:18 PM   Modules accepted: Orders

## 2016-09-15 LAB — URINALYSIS W MICROSCOPIC + REFLEX CULTURE
Bilirubin Urine: NEGATIVE
CASTS: NONE SEEN [LPF]
CRYSTALS: NONE SEEN [HPF]
Glucose, UA: NEGATIVE
Hgb urine dipstick: NEGATIVE
Ketones, ur: NEGATIVE
NITRITE: NEGATIVE
PH: 5.5 (ref 5.0–8.0)
Protein, ur: NEGATIVE
SPECIFIC GRAVITY, URINE: 1.025 (ref 1.001–1.035)
YEAST: NONE SEEN [HPF]

## 2016-09-16 LAB — URINE CULTURE: ORGANISM ID, BACTERIA: NO GROWTH

## 2016-09-18 LAB — PAP, TP IMAGING W/ HPV RNA, RFLX HPV TYPE 16,18/45: HPV mRNA, High Risk: NOT DETECTED

## 2016-10-05 DIAGNOSIS — M545 Low back pain: Secondary | ICD-10-CM | POA: Diagnosis not present

## 2016-10-05 DIAGNOSIS — M9903 Segmental and somatic dysfunction of lumbar region: Secondary | ICD-10-CM | POA: Diagnosis not present

## 2016-10-05 DIAGNOSIS — M9904 Segmental and somatic dysfunction of sacral region: Secondary | ICD-10-CM | POA: Diagnosis not present

## 2016-10-05 DIAGNOSIS — M461 Sacroiliitis, not elsewhere classified: Secondary | ICD-10-CM | POA: Diagnosis not present

## 2016-11-25 ENCOUNTER — Other Ambulatory Visit: Payer: Self-pay | Admitting: Women's Health

## 2016-11-25 DIAGNOSIS — F411 Generalized anxiety disorder: Secondary | ICD-10-CM

## 2017-01-09 ENCOUNTER — Encounter: Payer: Self-pay | Admitting: Gynecology

## 2017-02-08 DIAGNOSIS — J069 Acute upper respiratory infection, unspecified: Secondary | ICD-10-CM | POA: Diagnosis not present

## 2017-02-20 DIAGNOSIS — B9689 Other specified bacterial agents as the cause of diseases classified elsewhere: Secondary | ICD-10-CM | POA: Diagnosis not present

## 2017-02-20 DIAGNOSIS — J209 Acute bronchitis, unspecified: Secondary | ICD-10-CM | POA: Diagnosis not present

## 2017-02-20 DIAGNOSIS — J019 Acute sinusitis, unspecified: Secondary | ICD-10-CM | POA: Diagnosis not present

## 2017-03-05 ENCOUNTER — Telehealth: Payer: Self-pay | Admitting: *Deleted

## 2017-03-05 NOTE — Telephone Encounter (Signed)
(  pt aware you are out of the office) Pt called leaving for [place] on 03/15/17 cycle due to start around 03/14/17 asked if any way cycle can be delayed? I explained to patient not sure if this can be done since trip is next week, pt understands if not. I told her I would confirm with you. Please advise

## 2017-03-10 NOTE — Telephone Encounter (Signed)
You are right, difficult to prevent cycle week due  if not on OC's, (if on oc's can continue and skip placebo week, but don't think she is on)  Tell her to have a good vacation and wear sunscreen!

## 2017-03-11 NOTE — Telephone Encounter (Signed)
Left detailed message on pt voicemail.

## 2017-03-21 DIAGNOSIS — H524 Presbyopia: Secondary | ICD-10-CM | POA: Diagnosis not present

## 2017-03-21 DIAGNOSIS — H5213 Myopia, bilateral: Secondary | ICD-10-CM | POA: Diagnosis not present

## 2017-04-05 DIAGNOSIS — M9903 Segmental and somatic dysfunction of lumbar region: Secondary | ICD-10-CM | POA: Diagnosis not present

## 2017-04-05 DIAGNOSIS — M545 Low back pain: Secondary | ICD-10-CM | POA: Diagnosis not present

## 2017-04-05 DIAGNOSIS — M9904 Segmental and somatic dysfunction of sacral region: Secondary | ICD-10-CM | POA: Diagnosis not present

## 2017-04-05 DIAGNOSIS — M461 Sacroiliitis, not elsewhere classified: Secondary | ICD-10-CM | POA: Diagnosis not present

## 2017-05-09 ENCOUNTER — Telehealth: Payer: Self-pay | Admitting: *Deleted

## 2017-05-09 DIAGNOSIS — F411 Generalized anxiety disorder: Secondary | ICD-10-CM

## 2017-05-09 MED ORDER — ALPRAZOLAM 0.25 MG PO TABS: 0.2500 mg | ORAL_TABLET | Freq: Every evening | ORAL | 0 refills | Status: DC | PRN

## 2017-05-09 NOTE — Telephone Encounter (Signed)
Pt called c/o requesting refill Xanax 0.25 mg, last filled 09/14/16 with 1 refill.

## 2017-05-09 NOTE — Telephone Encounter (Signed)
Ok for refill? 

## 2017-05-09 NOTE — Telephone Encounter (Signed)
Rx called in, left on voicemail called in.

## 2017-06-21 DIAGNOSIS — F411 Generalized anxiety disorder: Secondary | ICD-10-CM | POA: Diagnosis not present

## 2017-06-21 DIAGNOSIS — Z23 Encounter for immunization: Secondary | ICD-10-CM | POA: Diagnosis not present

## 2017-07-31 ENCOUNTER — Other Ambulatory Visit: Payer: Self-pay | Admitting: Women's Health

## 2017-07-31 DIAGNOSIS — F411 Generalized anxiety disorder: Secondary | ICD-10-CM

## 2017-07-31 NOTE — Telephone Encounter (Signed)
Okay for refill?  

## 2017-07-31 NOTE — Telephone Encounter (Signed)
Called into pharmacy

## 2017-08-09 DIAGNOSIS — M461 Sacroiliitis, not elsewhere classified: Secondary | ICD-10-CM | POA: Diagnosis not present

## 2017-08-09 DIAGNOSIS — M9904 Segmental and somatic dysfunction of sacral region: Secondary | ICD-10-CM | POA: Diagnosis not present

## 2017-08-09 DIAGNOSIS — M545 Low back pain: Secondary | ICD-10-CM | POA: Diagnosis not present

## 2017-08-09 DIAGNOSIS — M9903 Segmental and somatic dysfunction of lumbar region: Secondary | ICD-10-CM | POA: Diagnosis not present

## 2017-08-14 DIAGNOSIS — M461 Sacroiliitis, not elsewhere classified: Secondary | ICD-10-CM | POA: Diagnosis not present

## 2017-08-14 DIAGNOSIS — M9904 Segmental and somatic dysfunction of sacral region: Secondary | ICD-10-CM | POA: Diagnosis not present

## 2017-08-14 DIAGNOSIS — M9903 Segmental and somatic dysfunction of lumbar region: Secondary | ICD-10-CM | POA: Diagnosis not present

## 2017-08-14 DIAGNOSIS — M545 Low back pain: Secondary | ICD-10-CM | POA: Diagnosis not present

## 2017-09-18 DIAGNOSIS — F411 Generalized anxiety disorder: Secondary | ICD-10-CM | POA: Diagnosis not present

## 2017-09-18 DIAGNOSIS — D500 Iron deficiency anemia secondary to blood loss (chronic): Secondary | ICD-10-CM | POA: Diagnosis not present

## 2017-12-09 ENCOUNTER — Ambulatory Visit: Payer: BLUE CROSS/BLUE SHIELD | Admitting: Family Medicine

## 2017-12-12 DIAGNOSIS — M25561 Pain in right knee: Secondary | ICD-10-CM | POA: Diagnosis not present

## 2017-12-12 DIAGNOSIS — M25562 Pain in left knee: Secondary | ICD-10-CM | POA: Diagnosis not present

## 2017-12-27 ENCOUNTER — Other Ambulatory Visit: Payer: Self-pay | Admitting: Women's Health

## 2017-12-27 DIAGNOSIS — F411 Generalized anxiety disorder: Secondary | ICD-10-CM

## 2017-12-31 ENCOUNTER — Other Ambulatory Visit: Payer: Self-pay | Admitting: Women's Health

## 2017-12-31 DIAGNOSIS — F411 Generalized anxiety disorder: Secondary | ICD-10-CM

## 2018-01-02 DIAGNOSIS — S70369A Insect bite (nonvenomous), unspecified thigh, initial encounter: Secondary | ICD-10-CM | POA: Diagnosis not present

## 2018-01-07 ENCOUNTER — Telehealth: Payer: Self-pay | Admitting: *Deleted

## 2018-01-07 DIAGNOSIS — F411 Generalized anxiety disorder: Secondary | ICD-10-CM

## 2018-01-07 MED ORDER — ALPRAZOLAM 0.25 MG PO TABS: ORAL_TABLET | ORAL | 0 refills | Status: DC

## 2018-01-07 NOTE — Telephone Encounter (Signed)
Rx called in. Tried to relay to patient, but voicemail was full

## 2018-01-07 NOTE — Telephone Encounter (Signed)
Ok for refill? 

## 2018-01-07 NOTE — Telephone Encounter (Signed)
Patient called requesting refill on xanax 0.25 mg, annual schedule on 03/04/18.  Okay to refill?

## 2018-03-04 ENCOUNTER — Encounter: Payer: BLUE CROSS/BLUE SHIELD | Admitting: Women's Health

## 2018-03-19 DIAGNOSIS — S50812A Abrasion of left forearm, initial encounter: Secondary | ICD-10-CM | POA: Diagnosis not present

## 2018-03-19 DIAGNOSIS — D500 Iron deficiency anemia secondary to blood loss (chronic): Secondary | ICD-10-CM | POA: Diagnosis not present

## 2018-03-19 DIAGNOSIS — F411 Generalized anxiety disorder: Secondary | ICD-10-CM | POA: Diagnosis not present

## 2018-03-20 ENCOUNTER — Encounter: Payer: Self-pay | Admitting: Gynecology

## 2018-03-20 ENCOUNTER — Ambulatory Visit: Payer: BLUE CROSS/BLUE SHIELD | Admitting: Gynecology

## 2018-03-20 VITALS — BP 118/76

## 2018-03-20 DIAGNOSIS — N393 Stress incontinence (female) (male): Secondary | ICD-10-CM

## 2018-03-20 DIAGNOSIS — N8189 Other female genital prolapse: Secondary | ICD-10-CM

## 2018-03-20 DIAGNOSIS — N898 Other specified noninflammatory disorders of vagina: Secondary | ICD-10-CM | POA: Diagnosis not present

## 2018-03-20 LAB — WET PREP FOR TRICH, YEAST, CLUE

## 2018-03-20 MED ORDER — FLUCONAZOLE 150 MG PO TABS: 150.0000 mg | ORAL_TABLET | Freq: Every day | ORAL | 0 refills | Status: DC

## 2018-03-20 NOTE — Patient Instructions (Addendum)
Take the Diflucan pill daily for 3 days to treat the yeast infection. Call if you want a referral to the urologist to consider surgery for the urinary incontinence.

## 2018-03-20 NOTE — Progress Notes (Signed)
    Tina Allen 1966-04-06 [id number]        52 y.o.  G2P1011 presents with a week's history of vaginal irritation itching and discharge.  Tried OTC Monistat without success.  No urinary symptoms such as frequency dysuria urgency low back pain fever or chills.  She does note feeling pressure like something is falling out when she is on her feet as well as having issues with stress urinary incontinence noting loss of urine with laughing coughing sneezing using the trampoline or running.  No urgency symptoms.  Has gone on for years but seems to be progressively getting worse.  Past medical history,surgical history, problem list, medications, allergies, family history and social history were all reviewed and documented in the EPIC chart.  Directed ROS with pertinent positives and negatives documented in the history of present illness/assessment and plan.  Exam: Caryn Bee assistant Vitals:   03/20/18 1537  BP: 118/76   General appearance:  Normal Spine straight without CVA tenderness Abdomen soft nontender without masses guarding rebound Pelvic external BUS vagina with early menses.  Clumpy white discharge noted.  Cervix normal.  Mild uterine descent, cystocele and rectocele with straining. Uterus normal size midline mobile nontender.  Adnexa without masses or tenderness.  Rectal exam normal  Assessment/Plan:  52 y.o. G2P1011 with:  1. History, exam and wet prep consistent with yeast vaginitis.  As she is starting her menses now we will cover with Diflucan 150 mg daily x3 days.  Follow-up if symptoms persist, worsen or recur. 2. Pelvic relaxation with stress urinary incontinence.  Minimal prolapse noted.  Is having issues with stress urinary incontinence which is starting to interfere with activities.  I reviewed options to include Kegel exercises and surgery.  Offered referral to urology to consider evaluation and surgery but at this point she is not interested.  Do not feel her minimal  prolapse at this point warrant surgery and she is going to monitor her symptoms.  She will follow-up if they worsen or other symptoms present themselves.    Anastasio Auerbach MD, 4:34 PM 03/20/2018

## 2018-03-22 LAB — URINALYSIS, COMPLETE W/RFL CULTURE
BILIRUBIN URINE: NEGATIVE
GLUCOSE, UA: NEGATIVE
Hyaline Cast: NONE SEEN /LPF
Nitrites, Initial: NEGATIVE
PROTEIN: NEGATIVE
Specific Gravity, Urine: 1.025 (ref 1.001–1.03)
pH: 5.5 (ref 5.0–8.0)

## 2018-03-22 LAB — CULTURE INDICATED

## 2018-03-22 LAB — URINE CULTURE
MICRO NUMBER: 90886246
RESULT: NO GROWTH
SPECIMEN QUALITY:: ADEQUATE

## 2018-04-25 DIAGNOSIS — M545 Low back pain: Secondary | ICD-10-CM | POA: Diagnosis not present

## 2018-04-25 DIAGNOSIS — M9903 Segmental and somatic dysfunction of lumbar region: Secondary | ICD-10-CM | POA: Diagnosis not present

## 2018-04-25 DIAGNOSIS — M9904 Segmental and somatic dysfunction of sacral region: Secondary | ICD-10-CM | POA: Diagnosis not present

## 2018-04-25 DIAGNOSIS — M461 Sacroiliitis, not elsewhere classified: Secondary | ICD-10-CM | POA: Diagnosis not present

## 2018-04-29 ENCOUNTER — Ambulatory Visit: Payer: BLUE CROSS/BLUE SHIELD | Admitting: Women's Health

## 2018-04-29 ENCOUNTER — Encounter: Payer: Self-pay | Admitting: Women's Health

## 2018-04-29 VITALS — BP 124/78 | Ht 64.0 in | Wt 150.0 lb

## 2018-04-29 DIAGNOSIS — B373 Candidiasis of vulva and vagina: Secondary | ICD-10-CM

## 2018-04-29 DIAGNOSIS — Z01419 Encounter for gynecological examination (general) (routine) without abnormal findings: Secondary | ICD-10-CM

## 2018-04-29 DIAGNOSIS — B3731 Acute candidiasis of vulva and vagina: Secondary | ICD-10-CM

## 2018-04-29 LAB — WET PREP FOR TRICH, YEAST, CLUE

## 2018-04-29 MED ORDER — FLUCONAZOLE 150 MG PO TABS: 150.0000 mg | ORAL_TABLET | Freq: Once | ORAL | 1 refills | Status: AC

## 2018-04-29 NOTE — Progress Notes (Signed)
Tina Allen [birth year] [id number]    History:    Presents for annual exam.  Regular monthly 5 to 6 days cycle with heavy flow/vasectomy.  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  Same partner with negative STD screen.  History of HSV, no outbreaks.  Hemochromatosis is scheduled for treatment.  Overdue for mammogram.  2017 benign colon polyp 5-year follow-up.  Past medical history, past surgical history, family history and social history were all reviewed and documented in the EPIC chart.  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  Tina Allen is 14 doing well, minimal support from her father.  ROS:  A ROS was performed and pertinent positives and negatives are included.  Exam:  Vitals:   04/29/18 1448  BP: 124/78  Weight: 150 lb (68 kg)  Height: 5\' 4"  (1.626 m)   Body mass index is 25.75 kg/m.   General appearance:  Normal Thyroid:  Symmetrical, normal in size, without palpable masses or nodularity. Respiratory  Auscultation:  Clear without wheezing or rhonchi Cardiovascular  Auscultation:  Regular rate, without rubs, murmurs or gallops  Edema/varicosities:  Not grossly evident Abdominal  Soft,nontender, without masses, guarding or rebound.  Liver/spleen:  No organomegaly noted  Hernia:  None appreciated  Skin  Inspection:  Grossly normal   Breasts: Examined lying and sitting.     Right: Without masses, retractions, discharge or axillary adenopathy.     Left: Without masses, retractions, discharge or axillary adenopathy. Gentitourinary   Inguinal/mons:  Normal without inguinal adenopathy  External genitalia:  Normal  BUS/Urethra/Skene's glands:  Normal  Vagina: Moderate curdy discharge wet prep positive for yeast  Cervix:  Normal  Uterus:  normal in size, shape and contour.  Midline and mobile  Adnexa/parametria:     Rt: Without masses or tenderness.   Lt: Without masses or tenderness.  Anus and perineum: Normal  Digital rectal  exam: Normal sphincter tone without palpated masses or tenderness  Assessment/Plan:  52 y.o. D WF G2, P1 for annual exam with no complaints..  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 x1 dose with 1 refill, yeast prevention discussed.  Instructed to call if no relief of discharge.  SBE's, reviewed importance of annual screening mammogram, breast center information given instructed to schedule.  Exercise, calcium rich foods, vitamin D 1000 daily encouraged.  Pap normal with negative HR HPV 2018, new screening guidelines reviewed.    [place], 5:43 PM 04/29/2018

## 2018-04-29 NOTE — Patient Instructions (Addendum)
Mammogram  [phone]  Vaginal Yeast infection, Adult Vaginal yeast infection is a condition that causes soreness, swelling, and redness (inflammation) of the vagina. It also causes vaginal discharge. This is a common condition. Some women get this infection frequently. What are the causes? This condition is caused by a change in the normal balance of the yeast (candida) and bacteria that live in the vagina. This change causes an overgrowth of yeast, which causes the inflammation. What increases the risk? This condition is more likely to develop in:  Women who take antibiotic medicines.  Women who have diabetes.  Women who take birth control pills.  Women who are pregnant.  Women who douche often.  Women who have a weak defense (immune) system.  Women who have been taking steroid medicines for a long time.  Women who frequently wear tight clothing.  What are the signs or symptoms? Symptoms of this condition include:  White, thick vaginal discharge.  Swelling, itching, redness, and irritation of the vagina. The lips of the vagina (vulva) may be affected as well.  Pain or a burning feeling while urinating.  Pain during sex.  How is this diagnosed? This condition is diagnosed with a medical history and physical exam. This will include a pelvic exam. Your health care provider will examine a sample of your vaginal discharge under a microscope. Your health care provider may send this sample for testing to confirm the diagnosis. How is this treated? This condition is treated with medicine. Medicines may be over-the-counter or prescription. You may be told to use one or more of the following:  Medicine that is taken orally.  Medicine that is applied as a cream.  Medicine that is inserted directly into the vagina (suppository).  Follow these instructions at home:  Take or apply over-the-counter and prescription medicines only as told by your health care provider.  Do not have  sex until your health care provider has approved. Tell your sex partner that you have a yeast infection. That person should go to his or her health care provider if he or she develops symptoms.  Do not wear tight clothes, such as pantyhose or tight pants.  Avoid using tampons until your health care provider approves.  Eat more yogurt. This may help to keep your yeast infection from returning.  Try taking a sitz bath to help with discomfort. This is a warm water bath that is taken while you are sitting down. The water should only come up to your hips and should cover your buttocks. Do this 3-4 times per day or as told by your health care provider.  Do not douche.  Wear breathable, cotton underwear.  If you have diabetes, keep your blood sugar levels under control. Contact a health care provider if:  You have a fever.  Your symptoms go away and then return.  Your symptoms do not get better with treatment.  Your symptoms get worse.  You have new symptoms.  You develop blisters in or around your vagina.  You have blood coming from your vagina and it is not your menstrual period.  You develop pain in your abdomen. This information is not intended to replace advice given to you by your health care provider. Make sure you discuss any questions you have with your health care provider. Document Released: 05/23/2005 Document Revised: 01/25/2016 Document Reviewed: 02/14/2015 Elsevier Interactive Patient Education  2018 [place] Maintenance, Female Adopting a healthy lifestyle and getting preventive care can go a long way to  promote health and wellness. Talk with your health care provider about what schedule of regular examinations is right for you. This is a good chance for you to check in with your provider about disease prevention and staying healthy. In between checkups, there are plenty of things you can do on your own. Experts have done a lot of research about which  lifestyle changes and preventive measures are most likely to keep you healthy. Ask your health care provider for more information. Weight and diet Eat a healthy diet  Be sure to include plenty of vegetables, fruits, low-fat dairy products, and lean protein.  Do not eat a lot of foods high in solid fats, added sugars, or salt.  Get regular exercise. This is one of the most important things you can do for your health. ? Most adults should exercise for at least 150 minutes each week. The exercise should increase your heart rate and make you sweat (moderate-intensity exercise). ? Most adults should also do strengthening exercises at least twice a week. This is in addition to the moderate-intensity exercise.  Maintain a healthy weight  Body mass index (BMI) is a measurement that can be used to identify possible weight problems. It estimates body fat based on height and weight. Your health care provider can help determine your BMI and help you achieve or maintain a healthy weight.  For females 2 years of age and older: ? A BMI below 18.5 is considered underweight. ? A BMI of 18.5 to 24.9 is normal. ? A BMI of 25 to 29.9 is considered overweight. ? A BMI of 30 and above is considered obese.  Watch levels of cholesterol and blood lipids  You should start having your blood tested for lipids and cholesterol at 54 years of age, then have this test every 5 years.  You may need to have your cholesterol levels checked more often if: ? Your lipid or cholesterol levels are high. ? You are older than 54 years of age. ? You are at high risk for heart disease.  Cancer screening Lung Cancer  Lung cancer screening is recommended for adults 58-18 years old who are at high risk for lung cancer because of a history of smoking.  A yearly low-dose CT scan of the lungs is recommended for people who: ? Currently smoke. ? Have quit within the past 15 years. ? Have at least a 30-pack-year history of  smoking. A pack year is smoking an average of one pack of cigarettes a day for 1 year.  Yearly screening should continue until it has been 15 years since you quit.  Yearly screening should stop if you develop a health problem that would prevent you from having lung cancer treatment.  Breast Cancer  Practice breast self-awareness. This means understanding how your breasts normally appear and feel.  It also means doing regular breast self-exams. Let your health care provider know about any changes, no matter how small.  If you are in your 20s or 30s, you should have a clinical breast exam (CBE) by a health care provider every 1-3 years as part of a regular health exam.  If you are 39 or older, have a CBE every year. Also consider having a breast X-ray (mammogram) every year.  If you have a family history of breast cancer, talk to your health care provider about genetic screening.  If you are at high risk for breast cancer, talk to your health care provider about having an MRI and a mammogram  every year.  Breast cancer gene (BRCA) assessment is recommended for women who have family members with BRCA-related cancers. BRCA-related cancers include: ? Breast. ? Ovarian. ? Tubal. ? Peritoneal cancers.  Results of the assessment will determine the need for genetic counseling and BRCA1 and BRCA2 testing.  Cervical Cancer Your health care provider may recommend that you be screened regularly for cancer of the pelvic organs (ovaries, uterus, and vagina). This screening involves a pelvic examination, including checking for microscopic changes to the surface of your cervix (Pap test). You may be encouraged to have this screening done every 3 years, beginning at age 10.  For women ages 71-65, health care providers may recommend pelvic exams and Pap testing every 3 years, or they may recommend the Pap and pelvic exam, combined with testing for human papilloma virus (HPV), every 5 years. Some types of  HPV increase your risk of cervical cancer. Testing for HPV may also be done on women of any age with unclear Pap test results.  Other health care providers may not recommend any screening for nonpregnant women who are considered low risk for pelvic cancer and who do not have symptoms. Ask your health care provider if a screening pelvic exam is right for you.  If you have had past treatment for cervical cancer or a condition that could lead to cancer, you need Pap tests and screening for cancer for at least 20 years after your treatment. If Pap tests have been discontinued, your risk factors (such as having a new sexual partner) need to be reassessed to determine if screening should resume. Some women have medical problems that increase the chance of getting cervical cancer. In these cases, your health care provider may recommend more frequent screening and Pap tests.  Colorectal Cancer  This type of cancer can be detected and often prevented.  Routine colorectal cancer screening usually begins at 54 years of age and continues through 54 years of age.  Your health care provider may recommend screening at an earlier age if you have risk factors for colon cancer.  Your health care provider may also recommend using home test kits to check for hidden blood in the stool.  A small camera at the end of a tube can be used to examine your colon directly (sigmoidoscopy or colonoscopy). This is done to check for the earliest forms of colorectal cancer.  Routine screening usually begins at age 14.  Direct examination of the colon should be repeated every 5-10 years through 54 years of age. However, you may need to be screened more often if early forms of precancerous polyps or small growths are found.  Skin Cancer  Check your skin from head to toe regularly.  Tell your health care provider about any new moles or changes in moles, especially if there is a change in a mole's shape or color.  Also tell  your health care provider if you have a mole that is larger than the size of a pencil eraser.  Always use sunscreen. Apply sunscreen liberally and repeatedly throughout the day.  Protect yourself by wearing long sleeves, pants, a wide-brimmed hat, and sunglasses whenever you are outside.  Heart disease, diabetes, and high blood pressure  High blood pressure causes heart disease and increases the risk of stroke. High blood pressure is more likely to develop in: ? People who have blood pressure in the high end of the normal range (130-139/85-89 mm Hg). ? People who are overweight or obese. ? People who  are African American.  If you are 71-32 years of age, have your blood pressure checked every 3-5 years. If you are 45 years of age or older, have your blood pressure checked every year. You should have your blood pressure measured twice-once when you are at a hospital or clinic, and once when you are not at a hospital or clinic. Record the average of the two measurements. To check your blood pressure when you are not at a hospital or clinic, you can use: ? An automated blood pressure machine at a pharmacy. ? A home blood pressure monitor.  If you are between 54 years and 35 years old, ask your health care provider if you should take aspirin to prevent strokes.  Have regular diabetes screenings. This involves taking a blood sample to check your fasting blood sugar level. ? If you are at a normal weight and have a low risk for diabetes, have this test once every three years after 54 years of age. ? If you are overweight and have a high risk for diabetes, consider being tested at a younger age or more often. Preventing infection Hepatitis B  If you have a higher risk for hepatitis B, you should be screened for this virus. You are considered at high risk for hepatitis B if: ? You were born in a country where hepatitis B is common. Ask your health care provider which countries are considered high  risk. ? Your parents were born in a high-risk country, and you have not been immunized against hepatitis B (hepatitis B vaccine). ? You have HIV or AIDS. ? You use needles to inject street drugs. ? You live with someone who has hepatitis B. ? You have had sex with someone who has hepatitis B. ? You get hemodialysis treatment. ? You take certain medicines for conditions, including cancer, organ transplantation, and autoimmune conditions.  Hepatitis C  Blood testing is recommended for: ? Everyone born from [birth year] through 1967. ? Anyone with known risk factors for hepatitis C.  Sexually transmitted infections (STIs)  You should be screened for sexually transmitted infections (STIs) including gonorrhea and chlamydia if: ? You are sexually active and are younger than 54 years of age. ? You are older than 54 years of age and your health care provider tells you that you are at risk for this type of infection. ? Your sexual activity has changed since you were last screened and you are at an increased risk for chlamydia or gonorrhea. Ask your health care provider if you are at risk.  If you do not have HIV, but are at risk, it may be recommended that you take a prescription medicine daily to prevent HIV infection. This is called pre-exposure prophylaxis (PrEP). You are considered at risk if: ? You are sexually active and do not regularly use condoms or know the HIV status of your partner(s). ? You take drugs by injection. ? You are sexually active with a partner who has HIV.  Talk with your health care provider about whether you are at high risk of being infected with HIV. If you choose to begin PrEP, you should first be tested for HIV. You should then be tested every 3 months for as long as you are taking PrEP. Pregnancy  If you are premenopausal and you may become pregnant, ask your health care provider about preconception counseling.  If you may become pregnant, take 400 to 800 micrograms  (mcg) of folic acid every day.  If you want  to prevent pregnancy, talk to your health care provider about birth control (contraception). Osteoporosis and menopause  Osteoporosis is a disease in which the bones lose minerals and strength with aging. This can result in serious bone fractures. Your risk for osteoporosis can be identified using a bone density scan.  If you are 58 years of age or older, or if you are at risk for osteoporosis and fractures, ask your health care provider if you should be screened.  Ask your health care provider whether you should take a calcium or vitamin D supplement to lower your risk for osteoporosis.  Menopause may have certain physical symptoms and risks.  Hormone replacement therapy may reduce some of these symptoms and risks. Talk to your health care provider about whether hormone replacement therapy is right for you. Follow these instructions at home:  Schedule regular health, dental, and eye exams.  Stay current with your immunizations.  Do not use any tobacco products including cigarettes, chewing tobacco, or electronic cigarettes.  If you are pregnant, do not drink alcohol.  If you are breastfeeding, limit how much and how often you drink alcohol.  Limit alcohol intake to no more than 1 drink per day for nonpregnant women. One drink equals 12 ounces of beer, 5 ounces of wine, or 1 ounces of hard liquor.  Do not use street drugs.  Do not share needles.  Ask your health care provider for help if you need support or information about quitting drugs.  Tell your health care provider if you often feel depressed.  Tell your health care provider if you have ever been abused or do not feel safe at home. This information is not intended to replace advice given to you by your health care provider. Make sure you discuss any questions you have with your health care provider. Document Released: 02/26/2011 Document Revised: 01/19/2016 Document Reviewed:  05/17/2015 Elsevier Interactive Patient Education  [employer].

## 2018-05-13 ENCOUNTER — Other Ambulatory Visit: Payer: Self-pay | Admitting: Women's Health

## 2018-05-13 DIAGNOSIS — Z1231 Encounter for screening mammogram for malignant neoplasm of breast: Secondary | ICD-10-CM

## 2018-05-14 ENCOUNTER — Ambulatory Visit
Admission: RE | Admit: 2018-05-14 | Discharge: 2018-05-14 | Disposition: A | Discharge status: Home / Self Care | Payer: BLUE CROSS/BLUE SHIELD | Source: Ambulatory Visit | Attending: Women's Health | Admitting: Women's Health

## 2018-05-14 DIAGNOSIS — Z1231 Encounter for screening mammogram for malignant neoplasm of breast: Secondary | ICD-10-CM

## 2018-05-23 ENCOUNTER — Telehealth: Payer: Self-pay | Admitting: *Deleted

## 2018-05-23 NOTE — Telephone Encounter (Signed)
Patient was seen for annual exam on 04/29/18 forgot to mention sleep issues, patient said she has trouble fall asleep and toss and turns half the night, has tried several OTC medication. Asked if Rx could be prescribed to help with this? OV to discuss? Please advise

## 2018-05-23 NOTE — Telephone Encounter (Signed)
Message left

## 2018-05-27 DIAGNOSIS — J019 Acute sinusitis, unspecified: Secondary | ICD-10-CM | POA: Diagnosis not present

## 2018-05-27 NOTE — Telephone Encounter (Signed)
TC, states terrible time sleeping, can't fall asleep or stay asleep. otc not working.  Sleep hygiene reviewed. Ambien 10 mg HS prn.  Reviewed best not to take daily, addictive prop reviewed.   Please call in ambien 10 mg po hs prn for sleep  #30  0 refills

## 2018-05-27 NOTE — Telephone Encounter (Signed)
message left

## 2018-05-27 NOTE — Telephone Encounter (Signed)
Patient called back and said best to call her after 4pm

## 2018-05-28 DIAGNOSIS — M545 Low back pain: Secondary | ICD-10-CM | POA: Diagnosis not present

## 2018-05-28 DIAGNOSIS — M9903 Segmental and somatic dysfunction of lumbar region: Secondary | ICD-10-CM | POA: Diagnosis not present

## 2018-05-28 DIAGNOSIS — M9904 Segmental and somatic dysfunction of sacral region: Secondary | ICD-10-CM | POA: Diagnosis not present

## 2018-05-28 DIAGNOSIS — M461 Sacroiliitis, not elsewhere classified: Secondary | ICD-10-CM | POA: Diagnosis not present

## 2018-05-28 MED ORDER — ZOLPIDEM TARTRATE 10 MG PO TABS: 10.0000 mg | ORAL_TABLET | Freq: Every evening | ORAL | 0 refills | Status: DC | PRN

## 2018-05-28 NOTE — Telephone Encounter (Signed)
Rx called in 

## 2018-05-30 DIAGNOSIS — M9904 Segmental and somatic dysfunction of sacral region: Secondary | ICD-10-CM | POA: Diagnosis not present

## 2018-05-30 DIAGNOSIS — M9903 Segmental and somatic dysfunction of lumbar region: Secondary | ICD-10-CM | POA: Diagnosis not present

## 2018-05-30 DIAGNOSIS — M461 Sacroiliitis, not elsewhere classified: Secondary | ICD-10-CM | POA: Diagnosis not present

## 2018-05-30 DIAGNOSIS — M545 Low back pain: Secondary | ICD-10-CM | POA: Diagnosis not present

## 2018-06-04 DIAGNOSIS — R197 Diarrhea, unspecified: Secondary | ICD-10-CM | POA: Diagnosis not present

## 2018-06-04 DIAGNOSIS — B9689 Other specified bacterial agents as the cause of diseases classified elsewhere: Secondary | ICD-10-CM | POA: Diagnosis not present

## 2018-06-04 DIAGNOSIS — H66001 Acute suppurative otitis media without spontaneous rupture of ear drum, right ear: Secondary | ICD-10-CM | POA: Diagnosis not present

## 2018-06-04 DIAGNOSIS — J019 Acute sinusitis, unspecified: Secondary | ICD-10-CM | POA: Diagnosis not present

## 2018-06-14 ENCOUNTER — Emergency Department (HOSPITAL_COMMUNITY)
Admission: EM | Admit: 2018-06-14 | Discharge: 2018-06-14 | Disposition: A | Discharge status: Home / Self Care | Payer: BLUE CROSS/BLUE SHIELD | Attending: Emergency Medicine | Admitting: Emergency Medicine

## 2018-06-14 ENCOUNTER — Encounter (HOSPITAL_COMMUNITY): Payer: Self-pay | Admitting: Emergency Medicine

## 2018-06-14 ENCOUNTER — Emergency Department (HOSPITAL_COMMUNITY): Payer: BLUE CROSS/BLUE SHIELD

## 2018-06-14 DIAGNOSIS — R519 Headache, unspecified: Secondary | ICD-10-CM

## 2018-06-14 DIAGNOSIS — G44229 Chronic tension-type headache, not intractable: Secondary | ICD-10-CM | POA: Diagnosis not present

## 2018-06-14 DIAGNOSIS — G8929 Other chronic pain: Secondary | ICD-10-CM

## 2018-06-14 DIAGNOSIS — R51 Headache: Secondary | ICD-10-CM

## 2018-06-14 DIAGNOSIS — Z79899 Other long term (current) drug therapy: Secondary | ICD-10-CM | POA: Diagnosis not present

## 2018-06-14 DIAGNOSIS — W19XXXA Unspecified fall, initial encounter: Secondary | ICD-10-CM

## 2018-06-14 DIAGNOSIS — G44209 Tension-type headache, unspecified, not intractable: Secondary | ICD-10-CM | POA: Diagnosis not present

## 2018-06-14 LAB — COMPREHENSIVE METABOLIC PANEL
ALT: 16 U/L (ref 0–44)
ANION GAP: 5 (ref 5–15)
AST: 18 U/L (ref 15–41)
Albumin: 3.9 g/dL (ref 3.5–5.0)
Alkaline Phosphatase: 46 U/L (ref 38–126)
BILIRUBIN TOTAL: 0.7 mg/dL (ref 0.3–1.2)
BUN: 13 mg/dL (ref 6–20)
CO2: 28 mmol/L (ref 22–32)
Calcium: 9.1 mg/dL (ref 8.9–10.3)
Chloride: 108 mmol/L (ref 98–111)
Creatinine, Ser: 1.07 mg/dL — ABNORMAL HIGH (ref 0.44–1.00)
GFR, EST NON AFRICAN AMERICAN: 58 mL/min — AB (ref >60)
Glucose, Bld: 109 mg/dL — ABNORMAL HIGH (ref 70–99)
POTASSIUM: 3.8 mmol/L (ref 3.5–5.1)
Sodium: 141 mmol/L (ref 135–145)
TOTAL PROTEIN: 6.8 g/dL (ref 6.5–8.1)

## 2018-06-14 LAB — CBC
HEMATOCRIT: 41.9 % (ref 36.0–46.0)
Hemoglobin: 13.3 g/dL (ref 12.0–15.0)
MCH: 31.8 pg (ref 26.0–34.0)
MCHC: 31.7 g/dL (ref 30.0–36.0)
MCV: 100.2 fL — AB (ref 80.0–100.0)
Platelets: 259 10*3/uL (ref 150–400)
RBC: 4.18 MIL/uL (ref 3.87–5.11)
RDW: 12 % (ref 11.5–15.5)
WBC: 6.5 10*3/uL (ref 4.0–10.5)
nRBC: 0 % (ref 0.0–0.2)

## 2018-06-14 LAB — FERRITIN: FERRITIN: 14 ng/mL (ref 11–307)

## 2018-06-14 MED ORDER — SODIUM CHLORIDE 0.9 % IV BOLUS
500.0000 mL | Freq: Once | INTRAVENOUS | Status: AC
  Administered 2018-06-14: 500 mL via INTRAVENOUS

## 2018-06-14 MED ORDER — PROCHLORPERAZINE MALEATE 5 MG PO TABS
10.0000 mg | ORAL_TABLET | Freq: Once | ORAL | Status: AC
  Administered 2018-06-14: 10 mg via ORAL
  Filled 2018-06-14: qty 2

## 2018-06-14 MED ORDER — DEXAMETHASONE SODIUM PHOSPHATE 10 MG/ML IJ SOLN
10.0000 mg | Freq: Once | INTRAMUSCULAR | Status: AC
  Administered 2018-06-14: 10 mg via INTRAVENOUS
  Filled 2018-06-14: qty 1

## 2018-06-14 NOTE — ED Notes (Signed)
Patient verbalizes understanding of discharge instructions. Opportunity for questioning and answers were provided. Armband removed by staff, pt discharged from ED ambulatory.   

## 2018-06-14 NOTE — ED Triage Notes (Addendum)
Pt presents to ED for assessment of frontal headache x 3 weeks.  Seen at [facility] x 2, diagnosed with sinus infection, statrted Augmentin without relief.  Went back, started on Levaquin and Prednisone.  Patient denies any improvement.  Experiencing intermittent dizziness with it.  States she got so dizzy in the bathroom this morning she fell backwards, hit her right hip on the bathtub, and hit her head on the wall with a small lump noted to patient's posterior head.  Denies LOC.  Pt is requesting a CT scan of her head due to her mom having an aneurysm at 38 and passing.

## 2018-06-14 NOTE — Discharge Instructions (Addendum)
[name],  It was a pleasure taking care of you today.  CT scan we did of your head did not show bleeding or any acute problems.  ~Take Care Dr. Eileen Stanford

## 2018-06-14 NOTE — ED Provider Notes (Signed)
[facility] EMERGENCY DEPARTMENT Provider Note   CSN: [id number] Arrival date & time: 06/14/18  1552     History   Chief Complaint Chief Complaint  Patient presents with  . Headache    HPI Tina Allen is a 52 y.o. female with medical history significant for familial hemochromatosis, depression, anxiety and cervical dysplasia presenting for evaluation after mechanical fall.  She reports that 3 weeks ago she began experiencing cold-like symptoms with subsequent bifrontal headaches for which she was started on Augmentin by her PCP.  This provided no relief so was transitioned to Levaquin and prednisone.  This morning she reports that she was blow drying her hair and tripped at the edge of the bouts of and experienced a fall with posterior head trauma.  Currently she reports of a 6/10 headache which is located at the occipital as well as the bifrontal area.  Since the onset of her previous headache 3 weeks ago she usually takes Excedrin as a day and that seems to help.  She denies any neurological deficits, fevers, chills, rhinorrhea, lacrimation.  Past Medical History:  Diagnosis Date  . HSV-2 (herpes simplex virus 2) infection 2006    Patient Active Problem List   Diagnosis Date Noted  . Cervical dysplasia 04/23/2013  . Depression with anxiety 04/23/2013  . Familial hemochromatosis ([place]) 09/06/2011    Past Surgical History:  Procedure Laterality Date  . CERVICAL BIOPSY  W/ LOOP ELECTRODE EXCISION  1996   DYSPLASIA     OB History    Gravida  2   Para  1   Term  1   Preterm      AB  1   Living  1     SAB  1   TAB      Ectopic      Multiple      Live Births  1            Home Medications    Prior to Admission medications   Medication Sig Start Date End Date Taking? Authorizing Provider  ALPRAZolam Duanne Moron) 0.25 MG tablet TAKE 1 TABLET BY MOUTH AT BEDTIME AS NEEDED FOR ANXIETY 01/07/18   Huel Cote, NP  ibuprofen  (ADVIL,MOTRIN) 600 MG tablet Take 1 tablet (600 mg total) by mouth every 8 (eight) hours as needed. 04/27/14   Huel Cote, NP  sertraline (ZOLOFT) 50 MG tablet Take 50 mg by mouth daily.    [provider]  zolpidem (AMBIEN) 10 MG tablet Take 1 tablet (10 mg total) by mouth at bedtime as needed for sleep. 05/28/18 06/27/18  Huel Cote, NP    Family History Family History  Problem Relation Age of Onset  . Aneurysm Mother        DESEACED AT 75.Marland Kitchen BRAIN  . Hypertension Mother   . Stroke Father        DECEASED AT 32 , [place]  . Hypertension Maternal Aunt   . Hypertension Maternal Grandmother   . Breast cancer Maternal Grandmother        diagnosed in her 72's  . Hypertension Maternal Grandfather     Social History Social History   Tobacco Use  . Smoking status: Never Smoker  . Smokeless tobacco: Never Used  Substance Use Topics  . Alcohol use: Yes    Comment: OCC  . Drug use: No     Allergies   Patient has no known allergies.   Review of Systems Review of Systems  Constitutional: Negative.   HENT: Negative.   Eyes: Negative.   Respiratory: Negative.   Cardiovascular: Negative.   Gastrointestinal: Negative.   Musculoskeletal: Negative.   Skin: Positive for wound (occipital hematoma).  Neurological: Positive for headaches (frontal, occipital ).  Psychiatric/Behavioral: Negative.      Physical Exam Updated Vital Signs BP (!) 122/96   Pulse 70   Temp 98.2 F (36.8 C) (Oral)   Resp 18   SpO2 100%   Physical Exam  Constitutional: She is oriented to person, place, and time. She appears well-developed and well-nourished.  HENT:  Head: Normocephalic and atraumatic.  Neck: Normal range of motion. Neck supple.  Cardiovascular: Normal rate and regular rhythm.  Pulmonary/Chest: Effort normal and breath sounds normal.  Abdominal: Soft. Bowel sounds are normal.  Musculoskeletal: Normal range of motion.  Neurological: She is alert and oriented to person,  place, and time. She has normal strength. She is not disoriented. No cranial nerve deficit or sensory deficit.  Cranial nerves II through XII intact. No focal neurological deficits Sensation intact Coordination intact  Skin: Skin is warm and dry.  Psychiatric: She has a normal mood and affect. Her behavior is normal. Her mood appears not anxious. She is not agitated.     ED Treatments / Results  Labs (all labs ordered are listed, but only abnormal results are displayed) Labs Reviewed  CBC - Abnormal; Notable for the following components:      Result Value   MCV 100.2 (*)    All other components within normal limits  COMPREHENSIVE METABOLIC PANEL - Abnormal; Notable for the following components:   Glucose, Bld 109 (*)    Creatinine, Ser 1.07 (*)    GFR calc non Af Amer 58 (*)    All other components within normal limits  FERRITIN    EKG None  Radiology Ct Head Wo Contrast  Result Date: 06/14/2018 CLINICAL DATA:  Post fall with posttraumatic headache. EXAM: CT HEAD WITHOUT CONTRAST TECHNIQUE: Contiguous axial images were obtained from the base of the skull through the vertex without intravenous contrast. COMPARISON:  Head CT 03/01/2018 FINDINGS: Brain: No intracranial hemorrhage, mass effect, or midline shift. No hydrocephalus. The basilar cisterns are patent. No evidence of territorial infarct or acute ischemia. No extra-axial or intracranial fluid collection. Vascular: No hyperdense vessel. Skull: No fracture or focal lesion. Sinuses/Orbits: Paranasal sinuses and mastoid air cells are clear. The visualized orbits are unremarkable. Other: Small left scalp hematoma. IMPRESSION: Left occipital scalp hematoma. No acute intracranial abnormality. No skull fracture. Electronically Signed   By: Keith Rake M.D.   On: 06/14/2018 22:09    Procedures Procedures (including critical care time)  Medications Ordered in ED Medications  sodium chloride 0.9 % bolus 500 mL (0 mLs  Intravenous Stopped 06/14/18 2139)  prochlorperazine (COMPAZINE) tablet 10 mg (10 mg Oral Given 06/14/18 2053)  dexamethasone (DECADRON) injection 10 mg (10 mg Intravenous Given 06/14/18 2108)     Initial Impression / Assessment and Plan / ED Course  I have reviewed the triage vital signs and the nursing notes.  Pertinent labs & imaging results that were available during my care of the patient were reviewed by me and considered in my medical decision making (see chart for details).   52 year old pleasant woman resenting for evaluation of headache after a mechanical fall.  Reports that this morning she was blow drying her hair and tripped at the edge of the bathtub and subsequently hit the back of her head.  Currently she  is complaining of a 6/10 pain.  On physical exam vital signs are within normal limit, she does have a hematoma in the occipital region, cranial nerves II to XII intact with no sensory or motor deficits.  CT head without contrast only showed left occipital scalp hematoma with no acute intracranial abnormality.  While in the ED, she received a headache cocktail with Compazine, Decadron and IV fluids and reported significant relief.  Final Clinical Impressions(s) / ED Diagnoses   Final diagnoses:  Accident due to mechanical fall without injury, initial encounter  Chronic tension type headache    ED Discharge Orders    None       Jean Rosenthal, MD 06/14/18 2231    Blanchie Dessert, MD 06/17/18 1353

## 2018-06-18 DIAGNOSIS — M461 Sacroiliitis, not elsewhere classified: Secondary | ICD-10-CM | POA: Diagnosis not present

## 2018-06-18 DIAGNOSIS — M9904 Segmental and somatic dysfunction of sacral region: Secondary | ICD-10-CM | POA: Diagnosis not present

## 2018-06-18 DIAGNOSIS — M9903 Segmental and somatic dysfunction of lumbar region: Secondary | ICD-10-CM | POA: Diagnosis not present

## 2018-06-18 DIAGNOSIS — M545 Low back pain: Secondary | ICD-10-CM | POA: Diagnosis not present

## 2018-06-20 DIAGNOSIS — R51 Headache: Secondary | ICD-10-CM | POA: Diagnosis not present

## 2018-06-20 DIAGNOSIS — J0111 Acute recurrent frontal sinusitis: Secondary | ICD-10-CM | POA: Diagnosis not present

## 2018-06-30 DIAGNOSIS — Z1283 Encounter for screening for malignant neoplasm of skin: Secondary | ICD-10-CM | POA: Diagnosis not present

## 2018-06-30 DIAGNOSIS — D2260 Melanocytic nevi of unspecified upper limb, including shoulder: Secondary | ICD-10-CM | POA: Diagnosis not present

## 2018-06-30 DIAGNOSIS — D225 Melanocytic nevi of trunk: Secondary | ICD-10-CM | POA: Diagnosis not present

## 2018-06-30 DIAGNOSIS — L720 Epidermal cyst: Secondary | ICD-10-CM | POA: Diagnosis not present

## 2018-07-09 DIAGNOSIS — M9904 Segmental and somatic dysfunction of sacral region: Secondary | ICD-10-CM | POA: Diagnosis not present

## 2018-07-09 DIAGNOSIS — M545 Low back pain: Secondary | ICD-10-CM | POA: Diagnosis not present

## 2018-07-09 DIAGNOSIS — M9903 Segmental and somatic dysfunction of lumbar region: Secondary | ICD-10-CM | POA: Diagnosis not present

## 2018-07-09 DIAGNOSIS — M461 Sacroiliitis, not elsewhere classified: Secondary | ICD-10-CM | POA: Diagnosis not present

## 2018-07-14 DIAGNOSIS — H6981 Other specified disorders of Eustachian tube, right ear: Secondary | ICD-10-CM | POA: Diagnosis not present

## 2018-07-14 DIAGNOSIS — J019 Acute sinusitis, unspecified: Secondary | ICD-10-CM | POA: Diagnosis not present

## 2018-07-14 DIAGNOSIS — J3489 Other specified disorders of nose and nasal sinuses: Secondary | ICD-10-CM | POA: Diagnosis not present

## 2018-07-16 DIAGNOSIS — M461 Sacroiliitis, not elsewhere classified: Secondary | ICD-10-CM | POA: Diagnosis not present

## 2018-07-16 DIAGNOSIS — M9903 Segmental and somatic dysfunction of lumbar region: Secondary | ICD-10-CM | POA: Diagnosis not present

## 2018-07-16 DIAGNOSIS — M545 Low back pain: Secondary | ICD-10-CM | POA: Diagnosis not present

## 2018-07-16 DIAGNOSIS — M9904 Segmental and somatic dysfunction of sacral region: Secondary | ICD-10-CM | POA: Diagnosis not present

## 2018-07-21 DIAGNOSIS — M545 Low back pain: Secondary | ICD-10-CM | POA: Diagnosis not present

## 2018-07-21 DIAGNOSIS — M9903 Segmental and somatic dysfunction of lumbar region: Secondary | ICD-10-CM | POA: Diagnosis not present

## 2018-07-21 DIAGNOSIS — M9904 Segmental and somatic dysfunction of sacral region: Secondary | ICD-10-CM | POA: Diagnosis not present

## 2018-07-21 DIAGNOSIS — M461 Sacroiliitis, not elsewhere classified: Secondary | ICD-10-CM | POA: Diagnosis not present

## 2018-08-15 DIAGNOSIS — J40 Bronchitis, not specified as acute or chronic: Secondary | ICD-10-CM | POA: Diagnosis not present

## 2018-08-15 DIAGNOSIS — R05 Cough: Secondary | ICD-10-CM | POA: Diagnosis not present

## 2018-08-15 DIAGNOSIS — J0101 Acute recurrent maxillary sinusitis: Secondary | ICD-10-CM | POA: Diagnosis not present

## 2018-11-05 ENCOUNTER — Encounter: Payer: Self-pay | Admitting: Women's Health

## 2018-11-05 ENCOUNTER — Other Ambulatory Visit: Payer: Self-pay

## 2018-11-05 ENCOUNTER — Ambulatory Visit (INDEPENDENT_AMBULATORY_CARE_PROVIDER_SITE_OTHER): Payer: Self-pay | Admitting: Women's Health

## 2018-11-05 VITALS — BP 124/90 | Wt 150.0 lb

## 2018-11-05 DIAGNOSIS — Z79890 Hormone replacement therapy (postmenopausal): Secondary | ICD-10-CM

## 2018-11-05 MED ORDER — MEDROXYPROGESTERONE ACETATE 5 MG PO TABS: 5.0000 mg | ORAL_TABLET | Freq: Every day | ORAL | 1 refills | Status: DC

## 2018-11-05 MED ORDER — ESTRADIOL 0.5 MG PO TABS: 0.5000 mg | ORAL_TABLET | Freq: Every day | ORAL | 1 refills | Status: DC

## 2018-11-05 NOTE — Progress Notes (Signed)
53 y.o. DWF G2P1 presents with complaints of bothersome hot flashes that began around 6 months ago. Occurs mostly at night, needing to change pajamas, poor sleep and fatigue during the day.  Regular monthly cycle until September, amenorrheic x 5 months and then normal cycle  in February. No hot flashes during week of menstruation. Denies any other symptoms other than insomnia which is associated hot flashes. Lots of personal stress but coping well. She has stopped taking antidepressants/anti-anxiety medications.  Not sexually active for several months, same partner.  Smoker, no other health problems known.  Exam: Appears well.   Perimenopausal hot flashes  Plan: Lifestyle changes: diet, stress management, environmental.  Options reviewed, start Estradiol 0.5 mg daily, Medroxyprogesterone 5 mg days 1-12 of each month.  Reviewed risks of blood clots, strokes and breast cancer, instructed to call if no relief of symptoms, reviewed may get a withdrawal bleed each month after the progesterone.

## 2018-11-05 NOTE — Patient Instructions (Signed)

## 2019-04-06 ENCOUNTER — Other Ambulatory Visit: Payer: Self-pay | Admitting: Women's Health

## 2019-04-06 DIAGNOSIS — Z1231 Encounter for screening mammogram for malignant neoplasm of breast: Secondary | ICD-10-CM

## 2019-05-06 ENCOUNTER — Encounter: Payer: Self-pay | Admitting: Women's Health

## 2019-05-06 ENCOUNTER — Ambulatory Visit (INDEPENDENT_AMBULATORY_CARE_PROVIDER_SITE_OTHER): Payer: BC Managed Care – PPO | Admitting: Women's Health

## 2019-05-06 ENCOUNTER — Other Ambulatory Visit: Payer: Self-pay

## 2019-05-06 VITALS — BP 124/80 | Ht 64.0 in | Wt 154.0 lb

## 2019-05-06 DIAGNOSIS — Z01419 Encounter for gynecological examination (general) (routine) without abnormal findings: Secondary | ICD-10-CM | POA: Diagnosis not present

## 2019-05-06 DIAGNOSIS — E559 Vitamin D deficiency, unspecified: Secondary | ICD-10-CM | POA: Diagnosis not present

## 2019-05-06 DIAGNOSIS — Z113 Encounter for screening for infections with a predominantly sexual mode of transmission: Secondary | ICD-10-CM

## 2019-05-06 DIAGNOSIS — Z13220 Encounter for screening for lipoid disorders: Secondary | ICD-10-CM | POA: Diagnosis not present

## 2019-05-06 DIAGNOSIS — F411 Generalized anxiety disorder: Secondary | ICD-10-CM

## 2019-05-06 MED ORDER — ALPRAZOLAM 0.25 MG PO TABS: ORAL_TABLET | ORAL | 0 refills | Status: DC

## 2019-05-06 NOTE — Patient Instructions (Addendum)
Good to see you!  Vit D 2000 iu daily  Health Maintenance for Postmenopausal Women Menopause is a normal process in which your ability to get pregnant comes to an end. This process happens slowly over many months or years, usually between the ages of 17 and 64. Menopause is complete when you have missed your menstrual periods for 12 months. It is important to talk with your health care provider about some of the most common conditions that affect women after menopause (postmenopausal women). These include heart disease, cancer, and bone loss (osteoporosis). Adopting a healthy lifestyle and getting preventive care can help to promote your health and wellness. The actions you take can also lower your chances of developing some of these common conditions. What should I know about menopause? During menopause, you may get a number of symptoms, such as:  Hot flashes. These can be moderate or severe.  Night sweats.  Decrease in sex drive.  Mood swings.  Headaches.  Tiredness.  Irritability.  Memory problems.  Insomnia. Choosing to treat or not to treat these symptoms is a decision that you make with your health care provider. Do I need hormone replacement therapy?  Hormone replacement therapy is effective in treating symptoms that are caused by menopause, such as hot flashes and night sweats.  Hormone replacement carries certain risks, especially as you become older. If you are thinking about using estrogen or estrogen with progestin, discuss the benefits and risks with your health care provider. What is my risk for heart disease and stroke? The risk of heart disease, heart attack, and stroke increases as you age. One of the causes may be a change in the body's hormones during menopause. This can affect how your body uses dietary fats, triglycerides, and cholesterol. Heart attack and stroke are medical emergencies. There are many things that you can do to help prevent heart disease and  stroke. Watch your blood pressure  High blood pressure causes heart disease and increases the risk of stroke. This is more likely to develop in people who have high blood pressure readings, are of African descent, or are overweight.  Have your blood pressure checked: ? Every 3-5 years if you are 61-53 years of age. ? Every year if you are 72 years old or older. Eat a healthy diet   Eat a diet that includes plenty of vegetables, fruits, low-fat dairy products, and lean protein.  Do not eat a lot of foods that are high in solid fats, added sugars, or sodium. Get regular exercise Get regular exercise. This is one of the most important things you can do for your health. Most adults should:  Try to exercise for at least 150 minutes each week. The exercise should increase your heart rate and make you sweat (moderate-intensity exercise).  Try to do strengthening exercises at least twice each week. Do these in addition to the moderate-intensity exercise.  Spend less time sitting. Even light physical activity can be beneficial. Other tips  Work with your health care provider to achieve or maintain a healthy weight.  Do not use any products that contain nicotine or tobacco, such as cigarettes, e-cigarettes, and chewing tobacco. If you need help quitting, ask your health care provider.  Know your numbers. Ask your health care provider to check your cholesterol and your blood sugar (glucose). Continue to have your blood tested as directed by your health care provider. Do I need screening for cancer? Depending on your health history and family history, you may need  to have cancer screening at different stages of your life. This may include screening for:  Breast cancer.  Cervical cancer.  Lung cancer.  Colorectal cancer. What is my risk for osteoporosis? After menopause, you may be at increased risk for osteoporosis. Osteoporosis is a condition in which bone destruction happens more  quickly than new bone creation. To help prevent osteoporosis or the bone fractures that can happen because of osteoporosis, you may take the following actions:  If you are 65-59 years old, get at least 1,000 mg of calcium and at least 600 mg of vitamin D per day.  If you are older than age 27 but younger than age 43, get at least 1,200 mg of calcium and at least 600 mg of vitamin D per day.  If you are older than age 84, get at least 1,200 mg of calcium and at least 800 mg of vitamin D per day. Smoking and drinking excessive alcohol increase the risk of osteoporosis. Eat foods that are rich in calcium and vitamin D, and do weight-bearing exercises several times each week as directed by your health care provider. How does menopause affect my mental health? Depression may occur at any age, but it is more common as you become older. Common symptoms of depression include:  Low or sad mood.  Changes in sleep patterns.  Changes in appetite or eating patterns.  Feeling an overall lack of motivation or enjoyment of activities that you previously enjoyed.  Frequent crying spells. Talk with your health care provider if you think that you are experiencing depression. General instructions See your health care provider for regular wellness exams and vaccines. This may include:  Scheduling regular health, dental, and eye exams.  Getting and maintaining your vaccines. These include: ? Influenza vaccine. Get this vaccine each year before the flu season begins. ? Pneumonia vaccine. ? Shingles vaccine. ? Tetanus, diphtheria, and pertussis (Tdap) booster vaccine. Your health care provider may also recommend other immunizations. Tell your health care provider if you have ever been abused or do not feel safe at home. Summary  Menopause is a normal process in which your ability to get pregnant comes to an end.  This condition causes hot flashes, night sweats, decreased interest in sex, mood swings,  headaches, or lack of sleep.  Treatment for this condition may include hormone replacement therapy.  Take actions to keep yourself healthy, including exercising regularly, eating a healthy diet, watching your weight, and checking your blood pressure and blood sugar levels.  Get screened for cancer and depression. Make sure that you are up to date with all your vaccines. This information is not intended to replace advice given to you by your health care provider. Make sure you discuss any questions you have with your health care provider. Document Released: 10/05/2005 Document Revised: 08/06/2018 Document Reviewed: 08/06/2018 Elsevier Patient Education  2020 [employer].

## 2019-05-06 NOTE — Progress Notes (Signed)
Tina Allen June 04, 1966 [id number]    History:    Presents for annual exam.  Has had cycles every 3 to 4 months for 2 to 3 days.  Occasional menopausal symptoms not daily.  HSV-2 no outbreaks.  2000 1 LEEP, 2010 LGSIL on Pap with negative colposcopy normal Paps after and normal normal mammogram history.  2018 benign colon polyp 5-year follow-up.  New partner in the past year, not currently sexually active.  Past medical history, past surgical history, family history and social history were all reviewed and documented in the EPIC chart.  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  Has had Gardasil.  ROS:  A ROS was performed and pertinent positives and negatives are included.  Exam:  Vitals:   05/06/19 1047  BP: 124/80  Weight: 154 lb (69.9 kg)  Height: 5\' 4"  (1.626 m)   Body mass index is 26.43 kg/m.   General appearance:  Normal Thyroid:  Symmetrical, normal in size, without palpable masses or nodularity. Respiratory  Auscultation:  Clear without wheezing or rhonchi Cardiovascular  Auscultation:  Regular rate, without rubs, murmurs or gallops  Edema/varicosities:  Not grossly evident Abdominal  Soft,nontender, without masses, guarding or rebound.  Liver/spleen:  No organomegaly noted  Hernia:  None appreciated  Skin  Inspection:  Grossly normal   Breasts: Examined lying and sitting.     Right: Without masses, retractions, discharge or axillary adenopathy.     Left: Without masses, retractions, discharge or axillary adenopathy. Gentitourinary   Inguinal/mons:  Normal without inguinal adenopathy  External genitalia:  Normal  BUS/Urethra/Skene's glands:  Normal  Vagina:  Normal  Cervix:  Normal  Uterus:  normal in size, shape and contour.  Midline and mobile  Adnexa/parametria:     Rt: Without masses or tenderness.   Lt: Without masses or tenderness.  Anus and perineum: Normal  Digital rectal exam: Normal sphincter tone without  palpated masses or tenderness  Assessment/Plan:  53 y.o. D WF G2, P1 for annual exam with no complaints.  Perimenopausal/condoms STD screen 2010 LGSIL negative colposcopy-normal Paps after  Plan: SBEs, continue annual screening mammogram, calcium rich foods, vitamin D 2000 daily encouraged.  Menopause reviewed, reviewed may have no further cycles.  Prescription refill for Xanax 0.25 uses less than 1 prescription annually prescription given.  Aware of addictive properties and to use sparingly.  Encouraged regular cardio and weightbearing type exercises.  CBC, CMP, lipid panel, vitamin D, GC/chlamydia, HIV, RPR, Pap normal 2018, new screening guidelines reviewed.    Huel Cote [facility], 11:08 AM 05/06/2019

## 2019-05-07 ENCOUNTER — Other Ambulatory Visit: Payer: Self-pay | Admitting: Women's Health

## 2019-05-07 ENCOUNTER — Other Ambulatory Visit: Payer: Self-pay

## 2019-05-07 DIAGNOSIS — E7800 Pure hypercholesterolemia, unspecified: Secondary | ICD-10-CM

## 2019-05-07 DIAGNOSIS — Z113 Encounter for screening for infections with a predominantly sexual mode of transmission: Secondary | ICD-10-CM

## 2019-05-07 DIAGNOSIS — E559 Vitamin D deficiency, unspecified: Secondary | ICD-10-CM

## 2019-05-07 LAB — COMPREHENSIVE METABOLIC PANEL
AG Ratio: 1.8 (calc) (ref 1.0–2.5)
ALT: 18 U/L (ref 6–29)
AST: 20 U/L (ref 10–35)
Albumin: 4.6 g/dL (ref 3.6–5.1)
Alkaline phosphatase (APISO): 59 U/L (ref 37–153)
BUN: 15 mg/dL (ref 7–25)
CO2: 26 mmol/L (ref 20–32)
Calcium: 9.6 mg/dL (ref 8.6–10.4)
Chloride: 105 mmol/L (ref 98–110)
Creat: 0.89 mg/dL (ref 0.50–1.05)
Globulin: 2.5 g/dL (calc) (ref 1.9–3.7)
Glucose, Bld: 82 mg/dL (ref 65–99)
Potassium: 4.4 mmol/L (ref 3.5–5.3)
Sodium: 141 mmol/L (ref 135–146)
Total Bilirubin: 0.7 mg/dL (ref 0.2–1.2)
Total Protein: 7.1 g/dL (ref 6.1–8.1)

## 2019-05-07 LAB — LIPID PANEL
Cholesterol: 245 mg/dL — ABNORMAL HIGH (ref <200)
HDL: 59 mg/dL (ref >=50)
LDL Cholesterol (Calc): 155 mg/dL (calc) — ABNORMAL HIGH
Non-HDL Cholesterol (Calc): 186 mg/dL (calc) — ABNORMAL HIGH (ref <130)
Total CHOL/HDL Ratio: 4.2 (calc) (ref <5.0)
Triglycerides: 171 mg/dL — ABNORMAL HIGH (ref <150)

## 2019-05-07 LAB — CBC WITH DIFFERENTIAL/PLATELET
Absolute Monocytes: 322 cells/uL (ref 200–950)
Basophils Absolute: 62 cells/uL (ref 0–200)
Basophils Relative: 1.3 %
Eosinophils Absolute: 91 cells/uL (ref 15–500)
Eosinophils Relative: 1.9 %
HCT: 41.5 % (ref 35.0–45.0)
Hemoglobin: 14.2 g/dL (ref 11.7–15.5)
Lymphs Abs: 1790 cells/uL (ref 850–3900)
MCH: 33.1 pg — ABNORMAL HIGH (ref 27.0–33.0)
MCHC: 34.2 g/dL (ref 32.0–36.0)
MCV: 96.7 fL (ref 80.0–100.0)
MPV: 10.9 fL (ref 7.5–12.5)
Monocytes Relative: 6.7 %
Neutro Abs: 2534 cells/uL (ref 1500–7800)
Neutrophils Relative %: 52.8 %
Platelets: 232 10*3/uL (ref 140–400)
RBC: 4.29 10*6/uL (ref 3.80–5.10)
RDW: 12.8 % (ref 11.0–15.0)
Total Lymphocyte: 37.3 %
WBC: 4.8 10*3/uL (ref 3.8–10.8)

## 2019-05-07 LAB — HIV ANTIBODY (ROUTINE TESTING W REFLEX): HIV 1&2 Ab, 4th Generation: NONREACTIVE

## 2019-05-07 LAB — VITAMIN D 25 HYDROXY (VIT D DEFICIENCY, FRACTURES): Vit D, 25-Hydroxy: 24 ng/mL — ABNORMAL LOW (ref 30–100)

## 2019-05-07 LAB — RPR: RPR Ser Ql: NONREACTIVE

## 2019-05-07 MED ORDER — VITAMIN D (ERGOCALCIFEROL) 1.25 MG (50000 UNIT) PO CAPS: 50000.0000 [IU] | ORAL_CAPSULE | ORAL | 0 refills | Status: DC

## 2019-05-07 NOTE — Addendum Note (Signed)
Addended by: Lorine Bears on: 05/07/2019 10:23 AM   Modules accepted: Orders

## 2019-05-20 ENCOUNTER — Encounter: Payer: Self-pay | Admitting: Gynecology

## 2019-05-21 ENCOUNTER — Ambulatory Visit
Admission: RE | Admit: 2019-05-21 | Discharge: 2019-05-21 | Disposition: A | Discharge status: Home / Self Care | Payer: BC Managed Care – PPO | Source: Ambulatory Visit | Attending: Women's Health | Admitting: Women's Health

## 2019-05-21 ENCOUNTER — Other Ambulatory Visit: Payer: Self-pay

## 2019-05-21 DIAGNOSIS — Z1231 Encounter for screening mammogram for malignant neoplasm of breast: Secondary | ICD-10-CM

## 2019-06-04 ENCOUNTER — Other Ambulatory Visit: Payer: Self-pay

## 2019-06-04 DIAGNOSIS — Z20822 Contact with and (suspected) exposure to covid-19: Secondary | ICD-10-CM

## 2019-06-06 LAB — NOVEL CORONAVIRUS, NAA: SARS-CoV-2, NAA: DETECTED — AB

## 2019-08-15 ENCOUNTER — Other Ambulatory Visit: Payer: Self-pay | Admitting: Women's Health

## 2019-08-15 DIAGNOSIS — E559 Vitamin D deficiency, unspecified: Secondary | ICD-10-CM

## 2019-09-03 DIAGNOSIS — D239 Other benign neoplasm of skin, unspecified: Secondary | ICD-10-CM

## 2019-09-03 HISTORY — DX: Other benign neoplasm of skin, unspecified: D23.9

## 2019-09-10 ENCOUNTER — Other Ambulatory Visit: Payer: Self-pay

## 2019-09-10 DIAGNOSIS — F411 Generalized anxiety disorder: Secondary | ICD-10-CM

## 2019-09-11 ENCOUNTER — Other Ambulatory Visit: Payer: Self-pay

## 2019-09-11 DIAGNOSIS — F411 Generalized anxiety disorder: Secondary | ICD-10-CM

## 2019-09-11 MED ORDER — ALPRAZOLAM 0.25 MG PO TABS: ORAL_TABLET | ORAL | 0 refills | Status: DC

## 2019-09-11 NOTE — Telephone Encounter (Signed)
?   Recently had filled?  Usually she uses one rx annually so please call and ask, if did not receive in the last two weeks ok for refill

## 2019-09-11 NOTE — Telephone Encounter (Signed)
Last Rx was 05/06/19 with 0 refills.

## 2019-09-11 NOTE — Telephone Encounter (Signed)
Ok for refill? 

## 2019-09-11 NOTE — Telephone Encounter (Signed)
Rx called in 

## 2019-09-30 ENCOUNTER — Ambulatory Visit (INDEPENDENT_AMBULATORY_CARE_PROVIDER_SITE_OTHER): Payer: 59 | Admitting: Family Medicine

## 2019-09-30 ENCOUNTER — Other Ambulatory Visit: Payer: Self-pay

## 2019-09-30 ENCOUNTER — Encounter: Payer: Self-pay | Admitting: Family Medicine

## 2019-09-30 VITALS — BP 126/88 | HR 71 | Temp 98.3°F | Resp 14 | Ht 64.5 in | Wt 159.2 lb

## 2019-09-30 DIAGNOSIS — N951 Menopausal and female climacteric states: Secondary | ICD-10-CM | POA: Diagnosis not present

## 2019-09-30 DIAGNOSIS — M25511 Pain in right shoulder: Secondary | ICD-10-CM | POA: Diagnosis not present

## 2019-09-30 DIAGNOSIS — M542 Cervicalgia: Secondary | ICD-10-CM | POA: Diagnosis not present

## 2019-09-30 DIAGNOSIS — M25551 Pain in right hip: Secondary | ICD-10-CM | POA: Insufficient documentation

## 2019-09-30 DIAGNOSIS — E785 Hyperlipidemia, unspecified: Secondary | ICD-10-CM | POA: Insufficient documentation

## 2019-09-30 DIAGNOSIS — E782 Mixed hyperlipidemia: Secondary | ICD-10-CM

## 2019-09-30 NOTE — Patient Instructions (Addendum)
Voltaren Gel - topical anti-inflammatory    Menopause Menopause may increase your risk for:  Loss of bone (osteoporosis), which causes bone breaks (fractures).  Depression.  Hardening and narrowing of the arteries (atherosclerosis), which can cause heart attacks and strokes. What are the causes? This condition is usually caused by a natural change in hormone levels that happens as you get older. The condition may also be caused by surgery to remove both ovaries (bilateral oophorectomy). Follow these instructions at home: Lifestyle  Do not use any products that contain nicotine or tobacco, such as cigarettes and e-cigarettes. If you need help quitting, ask your health care provider.  Get at least 30 minutes of physical activity on 5 or more days each week.  Avoid alcoholic and caffeinated beverages, as well as spicy foods. This may help prevent hot flashes.  Get 7-8 hours of sleep each night.  If you have hot flashes, try: ? Dressing in layers. ? Avoiding things that may trigger hot flashes, such as spicy food, hot drinks, alcohol, caffeine, warm places, or stress. ? Taking slow, deep breaths when a hot flash starts. ? Keeping a fan in your home and office.  Find ways to manage stress: regular exercise, meditation, yoga, qigong, Tai Chi, biofeedback, acupuncture or massage  Consider going to group therapy with other women who are having menopause symptoms. Ask your health care provider about recommended group therapy meetings.  Staying cool while sleeping: dress in light clothing, use layed bedding that can be easily removed, Sleep with a fan nearby, put an ice pack under your pillow and flip your pillow regularly  Eating and drinking  Eat a healthy, balanced diet that contains whole grains, lean protein, low-fat dairy, and plenty of fruits and vegetables.  Your health care provider may recommend adding more soy to your diet. Foods that contain soy include tofu, tempeh, and soy  milk.  Eat plenty of foods that contain calcium and vitamin D for bone health. Items that are rich in calcium include low-fat milk, yogurt, beans, almonds, sardines, broccoli, and kale. Medicines  Non-prescription medications for Hot Flashes ? Soy - eat 1-2 servings of soy foods daily ? Herbs: like black cohosh have shown some improvement with hot flashes  Talk with your health care provider before starting any herbal supplements. If prescribed, take vitamins and supplements as told by your health care provider. These may include: ? Calcium. Women age 33 and older should get 1,200 mg (milligrams) of calcium every day. ? Vitamin D. Women need 600-800 International Units of vitamin D each day.  Prescription Medications ? Hormone Treatment: increased risk of breast cancer and cardiovascular disease. Should be used for a short time and in women under 60 have a lower risk overall if they take it ? Non-Hormone Treatment: Paroxetine which is an antidepressant, has been shown to reduce Hot Flashes

## 2019-09-30 NOTE — Assessment & Plan Note (Signed)
Pt with 2 months of achy shoulder and hip pain. No specific signs on exam. Hx of neck injury which when going to the chiropractor would help with shoulder/hip issues. Though no sign of nerve entrapment today. Discussed PT and increasing general activity to help with possible arthritis symtpoms

## 2019-09-30 NOTE — Assessment & Plan Note (Signed)
Does not currently need statin. Already working on healthier eating. Encouraged exercise

## 2019-09-30 NOTE — Progress Notes (Signed)
Subjective:     Tina Allen is a 54 y.o. female presenting for Establish Care (sees Dr Donella Stade.), Menopause symptoms, and genital herpes (has questions)     HPI  #Menopause symptoms - achiness - feels like the whole right side of her body hurts more than the left - shoulder/hip - b/l knee pain - symptoms x 2 months - will get shoulder pain one day and hip pain another - will have a hard time walking in the morning due to ankle symptoms - feels like things improve with movement - endorses hot flashes and mood swings - GYN - prescribed hormones but due to breast cancer hx she has not started these) - LMP 06/2019 - getting night sweats (not changing clothes) - side sleeper - right and left  - Treatment: movement, advil PM - at night most nights of the week, does not typically need daytime treatment   Review of Systems  Endocrine: Negative for cold intolerance and heat intolerance.     Social History   Tobacco Use  Smoking Status Never Smoker  Smokeless Tobacco Never Used        Objective:    BP Readings from Last 3 Encounters:  09/30/19 126/88  05/06/19 124/80  11/05/18 124/90   Wt Readings from Last 3 Encounters:  09/30/19 159 lb 4 oz (72.2 kg)  05/06/19 154 lb (69.9 kg)  11/05/18 150 lb (68 kg)    BP 126/88   Pulse 71   Temp 98.3 F (36.8 C)   Resp 14   Ht 5' 4.5" (1.638 m)   Wt 159 lb 4 oz (72.2 kg)   LMP 07/09/2019   BMI 26.91 kg/m    Physical Exam Constitutional:      General: She is not in acute distress.    Appearance: She is well-developed. She is not diaphoretic.  HENT:     Right Ear: External ear normal.     Left Ear: External ear normal.  Eyes:     Conjunctiva/sclera: Conjunctivae normal.  Neck:     Comments: Neg Spurling  Cardiovascular:     Rate and Rhythm: Normal rate and regular rhythm.     Heart sounds: No murmur.  Pulmonary:     Effort: Pulmonary effort is normal. No respiratory distress.     Breath sounds:  Normal breath sounds. No wheezing.  Musculoskeletal:     Cervical back: Normal range of motion and neck supple. No tenderness.     Comments: Right Shoulder:  Inspection: no abnormalities Palpation: TTP over the biceps head, otherwise no TTP ROM: normal w/o pain Strength: normal Special tests: no signs of impingement or Rotator cuff pathology Right Hip:  Normal ROM w/o pain No TTP    Skin:    General: Skin is warm and dry.     Capillary Refill: Capillary refill takes less than 2 seconds.  Neurological:     Mental Status: She is alert. Mental status is at baseline.  Psychiatric:        Mood and Affect: Mood normal.        Behavior: Behavior normal.      The 10-year ASCVD risk score Mikey Bussing DC Jr., et al., 2013) is: 2.2%   Values used to calculate the score:     Age: 43 years     Sex: Female     Is Non-Hispanic African American: No     Diabetic: No     Tobacco smoker: No     Systolic Blood  Pressure: 126 mmHg     Is BP treated: No     HDL Cholesterol: 59 mg/dL     Total Cholesterol: 245 mg/dL       Assessment & Plan:   Problem List Items Addressed This Visit      Other   Hot flashes due to menopause - Primary    Saw GYN and did not want to do estrogen due to breast cancer risk. Discussed Soy in diet and black cohosh. She will try these and return if not improved.       Acute pain of right shoulder    Pt with 2 months of achy shoulder and hip pain. No specific signs on exam. Hx of neck injury which when going to the chiropractor would help with shoulder/hip issues. Though no sign of nerve entrapment today. Discussed PT and increasing general activity to help with possible arthritis symtpoms      Relevant Orders   Ambulatory referral to Physical Therapy   Acute right hip pain   Relevant Orders   Ambulatory referral to Physical Therapy   Neck pain on right side   Relevant Orders   Ambulatory referral to Physical Therapy   Hyperlipidemia    Does not currently need  statin. Already working on healthier eating. Encouraged exercise          Return in about 1 year (around 09/29/2020).  Lesleigh Noe, MD

## 2019-09-30 NOTE — Assessment & Plan Note (Signed)
Saw GYN and did not want to do estrogen due to breast cancer risk. Discussed Soy in diet and black cohosh. She will try these and return if not improved.

## 2019-10-13 ENCOUNTER — Encounter: Payer: Self-pay | Admitting: Nurse Practitioner

## 2020-05-03 ENCOUNTER — Ambulatory Visit (INDEPENDENT_AMBULATORY_CARE_PROVIDER_SITE_OTHER): Payer: 59 | Admitting: Nurse Practitioner

## 2020-05-03 ENCOUNTER — Other Ambulatory Visit: Payer: Self-pay

## 2020-05-03 ENCOUNTER — Encounter: Payer: Self-pay | Admitting: Nurse Practitioner

## 2020-05-03 VITALS — BP 120/83 | Ht 65.0 in | Wt 154.6 lb

## 2020-05-03 DIAGNOSIS — N951 Menopausal and female climacteric states: Secondary | ICD-10-CM | POA: Diagnosis not present

## 2020-05-03 DIAGNOSIS — Z01419 Encounter for gynecological examination (general) (routine) without abnormal findings: Secondary | ICD-10-CM

## 2020-05-03 DIAGNOSIS — M2550 Pain in unspecified joint: Secondary | ICD-10-CM

## 2020-05-03 DIAGNOSIS — Z13220 Encounter for screening for lipoid disorders: Secondary | ICD-10-CM

## 2020-05-03 DIAGNOSIS — F411 Generalized anxiety disorder: Secondary | ICD-10-CM | POA: Diagnosis not present

## 2020-05-03 DIAGNOSIS — E559 Vitamin D deficiency, unspecified: Secondary | ICD-10-CM

## 2020-05-03 MED ORDER — VENLAFAXINE HCL ER 37.5 MG PO CP24: 37.5000 mg | ORAL_CAPSULE | Freq: Every day | ORAL | 2 refills | Status: DC

## 2020-05-03 MED ORDER — ALPRAZOLAM 0.25 MG PO TABS: ORAL_TABLET | ORAL | 0 refills | Status: DC

## 2020-05-03 MED ORDER — IBUPROFEN 200 MG PO TABS: 200.0000 mg | ORAL_TABLET | Freq: Four times a day (QID) | ORAL | 3 refills | Status: DC | PRN

## 2020-05-03 NOTE — Progress Notes (Addendum)
° °  Tina Allen 1965-11-14 [id number]   History:  54 y.o. G2P1011 presents for annual exam. 2001 LEEP, 2010 LGSIL with negative colposcopy, subsequent paps normal. HSV-2, rare outbreaks. Having cycles every 4-6 months with complaints of hot flashes, night sweats, insomnia, joint pain and mood changes. Has tried over the counter supplements with minimal relief. Occasional panic attacks, takes xanax sparingly. Not currently sexually active.   Gynecologic History No LMP recorded (within months).   Contraception: abstinence Last Pap: 09/14/2016. Results were: normal Last mammogram: 05/21/2019. Results were: normal  Last colonoscopy: 07/23/2015. Results were: adenoma, 5 year follow up recommended  Past medical history, past surgical history, family history and social history were all reviewed and documented in the EPIC chart.  ROS:  A ROS was performed and pertinent positives and negatives are included.  Exam:  Vitals:   05/03/20 1156  BP: 120/83  Weight: 154 lb 9.6 oz (70.1 kg)  Height: 5\' 5"  (1.651 m)   Body mass index is 25.73 kg/m.  General appearance:  Normal Thyroid:  Symmetrical, normal in size, without palpable masses or nodularity. Respiratory  Auscultation:  Clear without wheezing or rhonchi Cardiovascular  Auscultation:  Regular rate, without rubs, murmurs or gallops  Edema/varicosities:  Not grossly evident Abdominal  Soft,nontender, without masses, guarding or rebound.  Liver/spleen:  No organomegaly noted  Hernia:  None appreciated  Skin  Inspection:  Grossly normal   Breasts: Examined lying and sitting.   Right: Without masses, retractions, discharge or axillary adenopathy.   Left: Without masses, retractions, discharge or axillary adenopathy. Gentitourinary   Inguinal/mons:  Normal without inguinal adenopathy  External genitalia:  Normal  BUS/Urethra/Skene's glands:  Normal  Vagina:  Normal  Cervix:  Normal  Uterus:  Anteverted, normal in size, shape and  contour.  Midline and mobile  Adnexa/parametria:     Rt: Without masses or tenderness.   Lt: Without masses or tenderness.  Anus and perineum: Normal  Digital rectal exam: Normal sphincter tone without palpated masses or tenderness  Assessment/Plan:  54 y.o. G2P101 for annual exam.   Well female exam with routine gynecological exam - Plan: CBC with Differential/Platelet, Comprehensive metabolic panel. Education provided on SBEs, importance of preventative screenings, current guidelines, high calcium diet, regular exercise, and multivitamin daily. Pap with HR HPV today.   Anxiety state - Plan: ALPRAZolam (XANAX) 0.25 MG tablet. Takes sparingly for panic attacks/anxiety. #30 with no refills.   Menopausal symptoms - Plan: venlafaxine XR (EFFEXOR XR) 37.5 MG 24 hr capsule. Take 1 capsule daily. #30 with refill x 2. She will let me know how she is doing in a couple of months. We discussed treatment options to include OTC supplements such as Black Cohosh, Ginseng, and Esperanza, SSRI such as Effexor or Prozac, and hormone replacement.  Arthralgia, unspecified joint - Plan: ibuprofen (ADVIL) 200 MG tablet. Takes occasionally for joint pain.   Vitamin D deficiency - Plan: VITAMIN D 25 Hydroxy (Vit-D Deficiency, Fractures)  Lipid screening - Plan: Lipid panel  Follow up in 1 year for annual     [place], 12:06 PM 05/03/2020

## 2020-05-03 NOTE — Patient Instructions (Signed)

## 2020-05-03 NOTE — Addendum Note (Signed)
Addended by: Thamas Jaegers on: 05/03/2020 12:26 PM   Modules accepted: Orders

## 2020-05-04 LAB — CBC WITH DIFFERENTIAL/PLATELET
Absolute Monocytes: 364 cells/uL (ref 200–950)
Basophils Absolute: 73 cells/uL (ref 0–200)
Basophils Relative: 1.4 %
Eosinophils Absolute: 88 cells/uL (ref 15–500)
Eosinophils Relative: 1.7 %
HCT: 42.8 % (ref 35.0–45.0)
Hemoglobin: 14.3 g/dL (ref 11.7–15.5)
Lymphs Abs: 2220 cells/uL (ref 850–3900)
MCH: 32.9 pg (ref 27.0–33.0)
MCHC: 33.4 g/dL (ref 32.0–36.0)
MCV: 98.6 fL (ref 80.0–100.0)
MPV: 11.1 fL (ref 7.5–12.5)
Monocytes Relative: 7 %
Neutro Abs: 2454 cells/uL (ref 1500–7800)
Neutrophils Relative %: 47.2 %
Platelets: 233 10*3/uL (ref 140–400)
RBC: 4.34 10*6/uL (ref 3.80–5.10)
RDW: 12.2 % (ref 11.0–15.0)
Total Lymphocyte: 42.7 %
WBC: 5.2 10*3/uL (ref 3.8–10.8)

## 2020-05-04 LAB — COMPREHENSIVE METABOLIC PANEL
AG Ratio: 1.9 (calc) (ref 1.0–2.5)
ALT: 17 U/L (ref 6–29)
AST: 20 U/L (ref 10–35)
Albumin: 4.7 g/dL (ref 3.6–5.1)
Alkaline phosphatase (APISO): 60 U/L (ref 37–153)
BUN: 16 mg/dL (ref 7–25)
CO2: 24 mmol/L (ref 20–32)
Calcium: 9.5 mg/dL (ref 8.6–10.4)
Chloride: 105 mmol/L (ref 98–110)
Creat: 0.95 mg/dL (ref 0.50–1.05)
Globulin: 2.5 g/dL (calc) (ref 1.9–3.7)
Glucose, Bld: 87 mg/dL (ref 65–99)
Potassium: 4.2 mmol/L (ref 3.5–5.3)
Sodium: 141 mmol/L (ref 135–146)
Total Bilirubin: 0.5 mg/dL (ref 0.2–1.2)
Total Protein: 7.2 g/dL (ref 6.1–8.1)

## 2020-05-04 LAB — LIPID PANEL
Cholesterol: 226 mg/dL — ABNORMAL HIGH (ref <200)
HDL: 51 mg/dL (ref >=50)
LDL Cholesterol (Calc): 149 mg/dL (calc) — ABNORMAL HIGH
Non-HDL Cholesterol (Calc): 175 mg/dL (calc) — ABNORMAL HIGH (ref <130)
Total CHOL/HDL Ratio: 4.4 (calc) (ref <5.0)
Triglycerides: 135 mg/dL (ref <150)

## 2020-05-04 LAB — PAP, TP IMAGING W/ HPV RNA, RFLX HPV TYPE 16,18/45: HPV DNA High Risk: NOT DETECTED

## 2020-05-04 LAB — VITAMIN D 25 HYDROXY (VIT D DEFICIENCY, FRACTURES): Vit D, 25-Hydroxy: 35 ng/mL (ref 30–100)

## 2020-05-09 ENCOUNTER — Other Ambulatory Visit: Payer: Self-pay

## 2020-05-09 MED ORDER — BIMATOPROST 0.03 % EX SOLN: 1.0000 "application " | CUTANEOUS | 3 refills | Status: DC

## 2020-05-09 NOTE — Progress Notes (Signed)
Latisse RF request

## 2020-05-17 ENCOUNTER — Other Ambulatory Visit: Payer: Self-pay

## 2020-05-17 MED ORDER — BIMATOPROST 0.03 % EX SOLN: 1.0000 "application " | CUTANEOUS | 3 refills | Status: DC

## 2020-05-17 NOTE — Progress Notes (Signed)
Per patient, pharmacy did not receive prescription.

## 2020-06-20 ENCOUNTER — Ambulatory Visit (INDEPENDENT_AMBULATORY_CARE_PROVIDER_SITE_OTHER): Payer: Self-pay

## 2020-06-20 ENCOUNTER — Other Ambulatory Visit: Payer: Self-pay

## 2020-06-20 DIAGNOSIS — L578 Other skin changes due to chronic exposure to nonionizing radiation: Secondary | ICD-10-CM

## 2020-06-20 DIAGNOSIS — I781 Nevus, non-neoplastic: Secondary | ICD-10-CM

## 2020-06-20 DIAGNOSIS — L730 Acne keloid: Secondary | ICD-10-CM

## 2020-06-20 DIAGNOSIS — Z872 Personal history of diseases of the skin and subcutaneous tissue: Secondary | ICD-10-CM

## 2020-06-20 NOTE — Progress Notes (Signed)
BBL recommended for sun-damaged skin. No hx of fever blisters. Reviewed risks and benefits. Last pass treated with ST-Filter 595 @ 08-07-29 10,000k AE. jj

## 2020-07-05 ENCOUNTER — Other Ambulatory Visit: Payer: Self-pay

## 2020-07-05 ENCOUNTER — Encounter: Payer: Self-pay | Admitting: Dermatology

## 2020-07-05 ENCOUNTER — Ambulatory Visit (INDEPENDENT_AMBULATORY_CARE_PROVIDER_SITE_OTHER): Payer: Self-pay | Admitting: Dermatology

## 2020-07-05 DIAGNOSIS — L988 Other specified disorders of the skin and subcutaneous tissue: Secondary | ICD-10-CM

## 2020-07-05 NOTE — Progress Notes (Signed)
   Follow-Up Visit   Subjective  Tina Allen is a 54 y.o. female who presents for the following: Facial Elastosis (Botox today).  The following portions of the chart were reviewed this encounter and updated as appropriate:  Tobacco  Allergies  Meds  Problems  Med Hx  Surg Hx  Fam Hx     Review of Systems:  No other skin or systemic complaints except as noted in HPI or Assessment and Plan.  Objective  Well appearing patient in no apparent distress; mood and affect are within normal limits.  A focused examination was performed including face. Relevant physical exam findings are noted in the Assessment and Plan.  Objective  Face: Rhytides and volume loss.   Images                   Assessment & Plan  Elastosis of skin Face  Botox today  20 units to frown complex 7.5 units each crow's feet 5 units to forehead  Botox Injection - Face Location: See attached image  Informed consent: Discussed risks (infection, pain, bleeding, bruising, swelling, allergic reaction, paralysis of nearby muscles, eyelid droop, double vision, neck weakness, difficulty breathing, headache, undesirable cosmetic result, and need for additional treatment) and benefits of the procedure, as well as the alternatives.  Informed consent was obtained.  Preparation: The area was cleansed with alcohol.  Procedure Details:  Botox was injected into the dermis with a 30-gauge needle. Pressure applied to any bleeding. Ice packs offered for swelling.  Lot Number:  [id number] Expiration:  08/2022  Total Units Injected:  40  Plan: Patient was instructed to remain upright for 4 hours. Patient was instructed to avoid massaging the face and avoid vigorous exercise for the rest of the day. Tylenol may be used for headache.  Allow 2 weeks before returning to clinic for additional dosing as needed. Patient will call for any problems.   Return for Botox in 3-4 months.  I, Ashok Cordia, CMA, am  acting as scribe for Sarina Ser, MD .  Documentation: I have reviewed the above documentation for accuracy and completeness, and I agree with the above.  Sarina Ser, MD

## 2020-07-27 ENCOUNTER — Other Ambulatory Visit: Payer: Self-pay | Admitting: Nurse Practitioner

## 2020-07-27 DIAGNOSIS — Z1231 Encounter for screening mammogram for malignant neoplasm of breast: Secondary | ICD-10-CM

## 2020-08-01 ENCOUNTER — Other Ambulatory Visit: Payer: Self-pay

## 2020-08-01 DIAGNOSIS — F411 Generalized anxiety disorder: Secondary | ICD-10-CM

## 2020-08-02 MED ORDER — ALPRAZOLAM 0.25 MG PO TABS: ORAL_TABLET | ORAL | 0 refills | Status: DC

## 2020-08-27 DIAGNOSIS — U071 COVID-19: Secondary | ICD-10-CM

## 2020-08-27 HISTORY — DX: COVID-19: U07.1

## 2020-09-07 ENCOUNTER — Encounter: Payer: 59 | Admitting: Dermatology

## 2020-09-08 ENCOUNTER — Ambulatory Visit: Payer: 59

## 2020-09-14 ENCOUNTER — Encounter: Payer: 59 | Admitting: Dermatology

## 2020-10-20 ENCOUNTER — Ambulatory Visit
Admission: RE | Admit: 2020-10-20 | Discharge: 2020-10-20 | Disposition: A | Discharge status: Home / Self Care | Payer: 59 | Source: Ambulatory Visit | Attending: Nurse Practitioner | Admitting: Nurse Practitioner

## 2020-10-20 ENCOUNTER — Other Ambulatory Visit: Payer: Self-pay

## 2020-10-20 DIAGNOSIS — Z1231 Encounter for screening mammogram for malignant neoplasm of breast: Secondary | ICD-10-CM

## 2020-11-02 ENCOUNTER — Other Ambulatory Visit: Payer: Self-pay

## 2020-11-02 ENCOUNTER — Encounter: Payer: Self-pay | Admitting: Dermatology

## 2020-11-02 ENCOUNTER — Ambulatory Visit (INDEPENDENT_AMBULATORY_CARE_PROVIDER_SITE_OTHER): Payer: 59 | Admitting: Dermatology

## 2020-11-02 DIAGNOSIS — Z86018 Personal history of other benign neoplasm: Secondary | ICD-10-CM | POA: Diagnosis not present

## 2020-11-02 DIAGNOSIS — Z1283 Encounter for screening for malignant neoplasm of skin: Secondary | ICD-10-CM | POA: Diagnosis not present

## 2020-11-02 DIAGNOSIS — L578 Other skin changes due to chronic exposure to nonionizing radiation: Secondary | ICD-10-CM | POA: Diagnosis not present

## 2020-11-02 DIAGNOSIS — L988 Other specified disorders of the skin and subcutaneous tissue: Secondary | ICD-10-CM | POA: Diagnosis not present

## 2020-11-02 DIAGNOSIS — L821 Other seborrheic keratosis: Secondary | ICD-10-CM

## 2020-11-02 DIAGNOSIS — L814 Other melanin hyperpigmentation: Secondary | ICD-10-CM

## 2020-11-02 DIAGNOSIS — D229 Melanocytic nevi, unspecified: Secondary | ICD-10-CM

## 2020-11-02 DIAGNOSIS — D1800 Hemangioma unspecified site: Secondary | ICD-10-CM

## 2020-11-02 DIAGNOSIS — L820 Inflamed seborrheic keratosis: Secondary | ICD-10-CM

## 2020-11-02 NOTE — Progress Notes (Signed)
Follow-Up Visit   Subjective  Tina Allen is a 55 y.o. female who presents for the following: Annual Exam (Hx dysplastic nevi ). Patient is also here for Botox today.  The patient presents for Total-Body Skin Exam (TBSE) for skin cancer screening and mole check.  The following portions of the chart were reviewed this encounter and updated as appropriate:   Tobacco  Allergies  Meds  Problems  Med Hx  Surg Hx  Fam Hx     Review of Systems:  No other skin or systemic complaints except as noted in HPI or Assessment and Plan.  Objective  Well appearing patient in no apparent distress; mood and affect are within normal limits.  A full examination was performed including scalp, head, eyes, ears, nose, lips, neck, chest, axillae, abdomen, back, buttocks, bilateral upper extremities, bilateral lower extremities, hands, feet, fingers, toes, fingernails, and toenails. All findings within normal limits unless otherwise noted below.  Objective  Face: Rhytides and volume loss.   Images    Objective  R thigh x 1: Erythematous keratotic or waxy stuck-on papule or plaque.   Assessment & Plan  Elastosis of skin Face  Botox injected as marked: - Frown complex 20 units - Crow's feet 7.5 units each for a total of 15 units  - Forehead 5 units  Botox Injection - Face Location: See attached image  Informed consent: Discussed risks (infection, pain, bleeding, bruising, swelling, allergic reaction, paralysis of nearby muscles, eyelid droop, double vision, neck weakness, difficulty breathing, headache, undesirable cosmetic result, and need for additional treatment) and benefits of the procedure, as well as the alternatives.  Informed consent was obtained.  Preparation: The area was cleansed with alcohol.  Procedure Details:  Botox was injected into the dermis with a 30-gauge needle. Pressure applied to any bleeding. Ice packs offered for swelling.  Lot Number:  [id number] Expiration:   12/2022  Total Units Injected:  40  Plan: Patient was instructed to remain upright for 4 hours. Patient was instructed to avoid massaging the face and avoid vigorous exercise for the rest of the day. Tylenol may be used for headache.  Allow 2 weeks before returning to clinic for additional dosing as needed. Patient will call for any problems.   Inflamed seborrheic keratosis R thigh x 1  Destruction of lesion - R thigh x 1 Complexity: simple   Destruction method: cryotherapy   Informed consent: discussed and consent obtained   Timeout:  patient name, date of birth, surgical site, and procedure verified Lesion destroyed using liquid nitrogen: Yes   Region frozen until ice ball extended beyond lesion: Yes   Outcome: patient tolerated procedure well with no complications   Post-procedure details: wound care instructions given    Skin cancer screening   Lentigines - Scattered tan macules - Due to sun exposure - Benign-appering, observe - Recommend daily broad spectrum sunscreen SPF 30+ to sun-exposed areas, reapply every 2 hours as needed. - Call for any changes  Seborrheic Keratoses - Stuck-on, waxy, tan-brown papules and plaques  - Discussed benign etiology and prognosis. - Observe - Call for any changes  Melanocytic Nevi - Tan-brown and/or pink-flesh-colored symmetric macules and papules - Benign appearing on exam today - Observation - Call clinic for new or changing moles - Recommend daily use of broad spectrum spf 30+ sunscreen to sun-exposed areas.   Hemangiomas - Red papules - Discussed benign nature - Observe - Call for any changes  Actinic Damage - Chronic, secondary to cumulative  UV/sun exposure - diffuse scaly erythematous macules with underlying dyspigmentation - Recommend daily broad spectrum sunscreen SPF 30+ to sun-exposed areas, reapply every 2 hours as needed.  - Call for new or changing lesions.  History of Dysplastic Nevi - No evidence of  recurrence today - Recommend regular full body skin exams - Recommend daily broad spectrum sunscreen SPF 30+ to sun-exposed areas, reapply every 2 hours as needed.  - Call if any new or changing lesions are noted between office visits  Skin cancer screening performed today.  Return in about 3 months (around 02/02/2021) for Botox injections.  Luther Redo, CMA, am acting as scribe for Sarina Ser, MD .  Documentation: I have reviewed the above documentation for accuracy and completeness, and I agree with the above.  Sarina Ser, MD

## 2020-11-15 ENCOUNTER — Other Ambulatory Visit: Payer: Self-pay

## 2020-11-15 ENCOUNTER — Encounter: Payer: Self-pay | Admitting: Family Medicine

## 2020-11-15 ENCOUNTER — Ambulatory Visit (INDEPENDENT_AMBULATORY_CARE_PROVIDER_SITE_OTHER): Payer: 59 | Admitting: Family Medicine

## 2020-11-15 VITALS — BP 110/80 | HR 80 | Temp 98.9°F | Ht 65.0 in | Wt 153.5 lb

## 2020-11-15 DIAGNOSIS — N951 Menopausal and female climacteric states: Secondary | ICD-10-CM

## 2020-11-15 DIAGNOSIS — F418 Other specified anxiety disorders: Secondary | ICD-10-CM | POA: Diagnosis not present

## 2020-11-15 DIAGNOSIS — J0100 Acute maxillary sinusitis, unspecified: Secondary | ICD-10-CM

## 2020-11-15 DIAGNOSIS — F411 Generalized anxiety disorder: Secondary | ICD-10-CM | POA: Diagnosis not present

## 2020-11-15 DIAGNOSIS — R5383 Other fatigue: Secondary | ICD-10-CM | POA: Insufficient documentation

## 2020-11-15 MED ORDER — ALPRAZOLAM 0.25 MG PO TABS: ORAL_TABLET | ORAL | 0 refills | Status: DC

## 2020-11-15 NOTE — Progress Notes (Signed)
Subjective:     Tina Allen is a 55 y.o. female presenting for Sinusitis (Drainage mostly at night ), Hot Flashes, and Fatigue     HPI  #Sinusitis - headache and pressure - worse at night - taking dayquil, pseudoephedrine - symptoms started 3 days ago  #panic attack - no recent episodes - does well if she has xanax on hand - difficulty sleeping -   covid x 2 - no cp, sob - no tachycardia   Endorses fatigue recently - always tired - worse on nights she doesn't sleep well - tries to get 8 hours  - will wake up at 3 am and will be awake for 1 hour - difficulty putting a description on fatigue - but tiredness is main symptom is tiredness - worse with menopause = 2 years of symptoms - 1 year since last cycle   - does have some hot flashes in setting of menopause, these will start in the chest area but no pain    Review of Systems   Social History   Tobacco Use  Smoking Status Never Smoker  Smokeless Tobacco Never Used        Objective:    BP Readings from Last 3 Encounters:  11/15/20 110/80  05/03/20 120/83  09/30/19 126/88   Wt Readings from Last 3 Encounters:  11/15/20 153 lb 8 oz (69.6 kg)  05/03/20 154 lb 9.6 oz (70.1 kg)  09/30/19 159 lb 4 oz (72.2 kg)    BP 110/80   Pulse 80   Temp 98.9 F (37.2 C) (Temporal)   Ht 5\' 5"  (1.651 m)   Wt 153 lb 8 oz (69.6 kg)   SpO2 96%   BMI 25.54 kg/m    Physical Exam Constitutional:      General: She is not in acute distress.    Appearance: She is well-developed. She is not diaphoretic.  HENT:     Head: Normocephalic and atraumatic.     Right Ear: Tympanic membrane and ear canal normal.     Left Ear: Tympanic membrane and ear canal normal.     Nose: Mucosal edema and rhinorrhea present.     Right Sinus: No maxillary sinus tenderness or frontal sinus tenderness.     Left Sinus: No maxillary sinus tenderness or frontal sinus tenderness.     Mouth/Throat:     Pharynx: Uvula midline. Posterior  oropharyngeal erythema present. No oropharyngeal exudate.     Tonsils: 0 on the right. 0 on the left.  Eyes:     General: No scleral icterus.    Conjunctiva/sclera: Conjunctivae normal.  Cardiovascular:     Rate and Rhythm: Normal rate and regular rhythm.     Heart sounds: Normal heart sounds. No murmur heard.   Pulmonary:     Effort: Pulmonary effort is normal. No respiratory distress.     Breath sounds: Normal breath sounds.  Musculoskeletal:     Cervical back: Neck supple.  Lymphadenopathy:     Cervical: No cervical adenopathy.  Skin:    General: Skin is warm and dry.     Capillary Refill: Capillary refill takes less than 2 seconds.  Neurological:     Mental Status: She is alert.      The 10-year ASCVD risk score Mikey Bussing DC Brooke Bonito., et al., 2013) is: 2.2%   Values used to calculate the score:     Age: 8 years     Sex: Female     Is Non-Hispanic African American: No  Diabetic: No     Tobacco smoker: No     Systolic Blood Pressure: 366 mmHg     Is BP treated: No     HDL Cholesterol: 51 mg/dL     Total Cholesterol: 226 mg/dL      Assessment & Plan:   Problem List Items Addressed This Visit      Cardiovascular and Mediastinum   Hot flashes due to menopause    Encouraged her to consider trial of effexor 37.5 mg to see if this improves her symptoms.         Other   Familial hemochromatosis ([place])    Will recheck labs today.       Relevant Orders   Comprehensive metabolic panel   CBC   Ferritin   Depression with anxiety - Primary    Rare use of xanax. ~#60 over one year. If needing more will consider alternative treatments.       Relevant Medications   ALPRAZolam (XANAX) 0.25 MG tablet   Other fatigue    Pt notes some fatigue. Did have covid 2020 and 2022 which could be related but notes present for 2+ years which may be menopause sleep disorder related. Will get blood work to rule out other causes though suspect poor sleep is major factor.       Relevant  Orders   TSH   Vitamin B12   Vitamin D, 25-hydroxy    Other Visit Diagnoses    Anxiety state       Relevant Medications   ALPRAZolam (XANAX) 0.25 MG tablet   Acute non-recurrent maxillary sinusitis         Discussed viral symptom management - if not resolved by Monday call and can plan for antibiotic course.   Return in about 4 weeks (around 12/13/2020) for annual.  Lesleigh Noe, MD  This visit occurred during the SARS-CoV-2 public health emergency.  Safety protocols were in place, including screening questions prior to the visit, additional usage of staff PPE, and extensive cleaning of exam room while observing appropriate contact time as indicated for disinfecting solutions.

## 2020-11-15 NOTE — Patient Instructions (Addendum)
Based on your symptoms, it looks like you have a virus.   Antibiotics are not need for a viral infection but the following will help:   1. Drink plenty of fluids 2. Get lots of rest  Sinus Congestion 1) Neti Pot (Saline rinse) -- 2 times day -- if tolerated 2) Flonase (Store Brand ok) - once daily 3) Over the counter congestion medications  Cough 1) Cough drops can be helpful 2) Nyquil (or nighttime cough medication) 3) Honey is proven to be one of the best cough medications  4) Cough medicine with Dextromethorphan can also be helpful  Sore Throat 1) Honey as above, cough drops 2) Ibuprofen or Aleve can be helpful 3) Salt water Gargles  If you develop fevers (Temperature >100.4), chills, worsening symptoms or symptoms lasting longer than 10 days return to clinic.     Consider restarting Effexor for hot flashes and anxiety/mood

## 2020-11-15 NOTE — Assessment & Plan Note (Signed)
Will recheck labs today .

## 2020-11-15 NOTE — Assessment & Plan Note (Signed)
Rare use of xanax. ~#60 over one year. If needing more will consider alternative treatments.

## 2020-11-15 NOTE — Assessment & Plan Note (Signed)
Pt notes some fatigue. Did have covid 2020 and 2022 which could be related but notes present for 2+ years which may be menopause sleep disorder related. Will get blood work to rule out other causes though suspect poor sleep is major factor.

## 2020-11-15 NOTE — Assessment & Plan Note (Signed)
Encouraged her to consider trial of effexor 37.5 mg to see if this improves her symptoms.

## 2020-11-16 ENCOUNTER — Other Ambulatory Visit: Payer: Self-pay | Admitting: Family Medicine

## 2020-11-16 DIAGNOSIS — E559 Vitamin D deficiency, unspecified: Secondary | ICD-10-CM

## 2020-11-16 LAB — COMPREHENSIVE METABOLIC PANEL
ALT: 24 U/L (ref 0–35)
AST: 19 U/L (ref 0–37)
Albumin: 4.6 g/dL (ref 3.5–5.2)
Alkaline Phosphatase: 80 U/L (ref 39–117)
BUN: 12 mg/dL (ref 6–23)
CO2: 28 mEq/L (ref 19–32)
Calcium: 10 mg/dL (ref 8.4–10.5)
Chloride: 103 mEq/L (ref 96–112)
Creatinine, Ser: 0.93 mg/dL (ref 0.40–1.20)
GFR: 68.43 mL/min (ref >60.00)
Glucose, Bld: 98 mg/dL (ref 70–99)
Potassium: 4.1 mEq/L (ref 3.5–5.1)
Sodium: 141 mEq/L (ref 135–145)
Total Bilirubin: 0.5 mg/dL (ref 0.2–1.2)
Total Protein: 7.1 g/dL (ref 6.0–8.3)

## 2020-11-16 LAB — CBC
HCT: 40.7 % (ref 36.0–46.0)
Hemoglobin: 14 g/dL (ref 12.0–15.0)
MCHC: 34.5 g/dL (ref 30.0–36.0)
MCV: 96.8 fl (ref 78.0–100.0)
Platelets: 236 10*3/uL (ref 150.0–400.0)
RBC: 4.21 Mil/uL (ref 3.87–5.11)
RDW: 12.5 % (ref 11.5–15.5)
WBC: 7.5 10*3/uL (ref 4.0–10.5)

## 2020-11-16 LAB — FERRITIN: Ferritin: 186.9 ng/mL (ref 10.0–291.0)

## 2020-11-16 LAB — TSH: TSH: 3.13 u[IU]/mL (ref 0.35–4.50)

## 2020-11-16 LAB — VITAMIN D 25 HYDROXY (VIT D DEFICIENCY, FRACTURES): VITD: 27.13 ng/mL — ABNORMAL LOW (ref 30.00–100.00)

## 2020-11-16 LAB — VITAMIN B12: Vitamin B-12: 262 pg/mL (ref 211–911)

## 2020-11-16 MED ORDER — VITAMIN D (ERGOCALCIFEROL) 1.25 MG (50000 UNIT) PO CAPS
50000.0000 [IU] | ORAL_CAPSULE | ORAL | 1 refills | Status: DC
Start: 2020-11-16 — End: 2021-05-30

## 2020-11-17 ENCOUNTER — Encounter: Payer: Self-pay | Admitting: Family Medicine

## 2020-11-17 DIAGNOSIS — J0140 Acute pansinusitis, unspecified: Secondary | ICD-10-CM

## 2020-11-17 MED ORDER — AMOXICILLIN-POT CLAVULANATE 875-125 MG PO TABS: 1.0000 | ORAL_TABLET | Freq: Two times a day (BID) | ORAL | 0 refills | Status: AC

## 2021-02-01 ENCOUNTER — Ambulatory Visit (INDEPENDENT_AMBULATORY_CARE_PROVIDER_SITE_OTHER): Payer: 59 | Admitting: Dermatology

## 2021-02-01 ENCOUNTER — Other Ambulatory Visit: Payer: Self-pay | Admitting: Dermatology

## 2021-02-01 ENCOUNTER — Other Ambulatory Visit: Payer: Self-pay

## 2021-02-01 DIAGNOSIS — R21 Rash and other nonspecific skin eruption: Secondary | ICD-10-CM

## 2021-02-01 MED ORDER — DOXYCYCLINE HYCLATE 100 MG PO TABS: 100.0000 mg | ORAL_TABLET | Freq: Two times a day (BID) | ORAL | 0 refills | Status: AC

## 2021-02-01 NOTE — Patient Instructions (Addendum)

## 2021-02-01 NOTE — Progress Notes (Signed)
   Follow-Up Visit   Subjective  Tina Allen is a 55 y.o. female who presents for the following: Rash (Of left leg - came up while she was in [place]. She went on a tour and does not remember getting any mosquito or bug bites. She went to the walk in at [facility] and was given Doxycycline and TMC 0.1% cream on Sunday. The rash is not improving.).  The following portions of the chart were reviewed this encounter and updated as appropriate:   Tobacco  Allergies  Meds  Problems  Med Hx  Surg Hx  Fam Hx      Review of Systems:  No other skin or systemic complaints except as noted in HPI or Assessment and Plan.  Objective  Well appearing patient in no apparent distress; mood and affect are within normal limits.  A focused examination was performed including left lower leg. Relevant physical exam findings are noted in the Assessment and Plan.  Left lat calf 2 annular plaques with central clearing      Assessment & Plan  Rash - cellulitis possibly insect bite related - associated with recent tropical vacation Left lat calf Will order labs for Lyme Disease and Bartonella.  Continue Doxycycline 100 mg 1 po bid with food and plenty of fluid for a full 3 weeks.  May continue TMC 0.1% cream qd-bid  Bartonella anitbody panel - Left lat calf  doxycycline (VIBRA-TABS) 100 MG tablet - Left lat calf Take 1 tablet (100 mg total) by mouth 2 (two) times daily for 14 days. With food and plenty of fluid  Lyme Ab/Western Blot Reflex - Left lat calf  Return if symptoms worsen or fail to improve.  I, Ashok Cordia, CMA, am acting as scribe for Sarina Ser, MD .  Documentation: I have reviewed the above documentation for accuracy and completeness, and I agree with the above.  Sarina Ser, MD

## 2021-02-02 ENCOUNTER — Encounter: Payer: Self-pay | Admitting: Dermatology

## 2021-02-02 ENCOUNTER — Telehealth: Payer: Self-pay

## 2021-02-02 LAB — BARTONELLA ANTIBODY PANEL
B Quintana IgM: NEGATIVE titer
B henselae IgG: NEGATIVE titer
B henselae IgM: NEGATIVE titer
B quintana IgG: NEGATIVE titer

## 2021-02-02 LAB — LYME DISEASE SEROLOGY W/REFLEX: Lyme Total Antibody EIA: NEGATIVE

## 2021-02-02 NOTE — Telephone Encounter (Signed)
-----   Message from Ralene Bathe, MD sent at 02/02/2021  3:25 PM EDT ----- Bartonella and Lyme disease antibodies = all negative from June 2022

## 2021-02-02 NOTE — Telephone Encounter (Signed)
Advised patient of results/hd  

## 2021-05-19 ENCOUNTER — Telehealth: Payer: Self-pay | Admitting: Family Medicine

## 2021-05-19 NOTE — Telephone Encounter (Signed)
Last OV- 11/15/2020 Next OV- 05/30/2021 Last Filled- 11/15/2020

## 2021-05-19 NOTE — Telephone Encounter (Signed)
  Encourage patient to contact the pharmacy for refills or they can request refills through [facility]:  Please schedule appointment if longer than 1 year  NEXT APPOINTMENT DATE:  MEDICATION:ALPRAZolam (XANAX) 0.25 MG tablet  Is the patient out of medication?   PHARMACY:[id number] - [place], [place] - [id number]   Let patient know to contact pharmacy at the end of the day to make sure medication is ready.  Please notify patient to allow 48-72 hours to process  CLINICAL FILLS OUT ALL BELOW:   LAST REFILL:  QTY:  REFILL DATE:    OTHER COMMENTS:    Okay for refill?  Please advise

## 2021-05-20 ENCOUNTER — Other Ambulatory Visit: Payer: Self-pay | Admitting: Family Medicine

## 2021-05-20 DIAGNOSIS — F411 Generalized anxiety disorder: Secondary | ICD-10-CM

## 2021-05-22 MED ORDER — ALPRAZOLAM 0.25 MG PO TABS: ORAL_TABLET | ORAL | 0 refills | Status: DC

## 2021-05-22 NOTE — Telephone Encounter (Signed)
Refill sent to pharmacy.   

## 2021-05-22 NOTE — Telephone Encounter (Signed)
Refill request Alprazolam Last refill 11/15/20 #30 Last office visit 11/15/20 Upcoming appointment 05/30/21

## 2021-05-30 ENCOUNTER — Other Ambulatory Visit: Payer: Self-pay

## 2021-05-30 ENCOUNTER — Ambulatory Visit (INDEPENDENT_AMBULATORY_CARE_PROVIDER_SITE_OTHER): Payer: 59 | Admitting: Family Medicine

## 2021-05-30 VITALS — BP 118/80 | HR 76 | Temp 97.1°F | Ht 64.0 in | Wt 152.1 lb

## 2021-05-30 DIAGNOSIS — Z1211 Encounter for screening for malignant neoplasm of colon: Secondary | ICD-10-CM | POA: Diagnosis not present

## 2021-05-30 DIAGNOSIS — Z0000 Encounter for general adult medical examination without abnormal findings: Secondary | ICD-10-CM

## 2021-05-30 DIAGNOSIS — D126 Benign neoplasm of colon, unspecified: Secondary | ICD-10-CM

## 2021-05-30 DIAGNOSIS — E782 Mixed hyperlipidemia: Secondary | ICD-10-CM

## 2021-05-30 DIAGNOSIS — N951 Menopausal and female climacteric states: Secondary | ICD-10-CM

## 2021-05-30 DIAGNOSIS — E559 Vitamin D deficiency, unspecified: Secondary | ICD-10-CM | POA: Diagnosis not present

## 2021-05-30 LAB — COMPREHENSIVE METABOLIC PANEL
ALT: 17 U/L (ref 0–35)
AST: 17 U/L (ref 0–37)
Albumin: 4.5 g/dL (ref 3.5–5.2)
Alkaline Phosphatase: 64 U/L (ref 39–117)
BUN: 12 mg/dL (ref 6–23)
CO2: 28 mEq/L (ref 19–32)
Calcium: 9.5 mg/dL (ref 8.4–10.5)
Chloride: 104 mEq/L (ref 96–112)
Creatinine, Ser: 0.93 mg/dL (ref 0.40–1.20)
GFR: 68.17 mL/min (ref >60.00)
Glucose, Bld: 91 mg/dL (ref 70–99)
Potassium: 4.2 mEq/L (ref 3.5–5.1)
Sodium: 140 mEq/L (ref 135–145)
Total Bilirubin: 0.6 mg/dL (ref 0.2–1.2)
Total Protein: 6.7 g/dL (ref 6.0–8.3)

## 2021-05-30 LAB — CBC
HCT: 39.6 % (ref 36.0–46.0)
Hemoglobin: 13.5 g/dL (ref 12.0–15.0)
MCHC: 34.2 g/dL (ref 30.0–36.0)
MCV: 96.8 fl (ref 78.0–100.0)
Platelets: 217 10*3/uL (ref 150.0–400.0)
RBC: 4.09 Mil/uL (ref 3.87–5.11)
RDW: 12.4 % (ref 11.5–15.5)
WBC: 5 10*3/uL (ref 4.0–10.5)

## 2021-05-30 LAB — LIPID PANEL
Cholesterol: 220 mg/dL — ABNORMAL HIGH (ref 0–200)
HDL: 50.8 mg/dL (ref >39.00)
LDL Cholesterol: 135 mg/dL — ABNORMAL HIGH (ref 0–99)
NonHDL: 168.93
Total CHOL/HDL Ratio: 4
Triglycerides: 168 mg/dL — ABNORMAL HIGH (ref 0.0–149.0)
VLDL: 33.6 mg/dL (ref 0.0–40.0)

## 2021-05-30 LAB — VITAMIN D 25 HYDROXY (VIT D DEFICIENCY, FRACTURES): VITD: 29.4 ng/mL — ABNORMAL LOW (ref 30.00–100.00)

## 2021-05-30 LAB — TSH: TSH: 3.43 u[IU]/mL (ref 0.35–5.50)

## 2021-05-30 NOTE — Progress Notes (Signed)
Annual Exam   Chief Complaint:  Chief Complaint  Patient presents with   Annual Exam    No concerns. Declined flu shot     History of Present Illness:  [name] is a 55 y.o. G2P1011 who LMP was No LMP recorded. Patient is perimenopausal., presents today for her annual examination.     Nutrition She does not know get adequate calcium and Vitamin D in her diet. Diet: tries to eat healthy Exercise: walking the dog, yoga  Safety The patient wears seatbelts: yes.     The patient feels safe at home and in their relationships: yes.   Menstrual:  Symptoms of menopause: hot flashes Did not like the effexor  GYN She is not sexually active.    Cervical Cancer Screening (21-65):   Last Pap:   September 2021 Results were: no abnormalities /neg HPV DNA   Breast Cancer Screening (Age 22-74):  There is no FH of breast cancer. There is no FH of ovarian cancer. BRCA screening Not Indicated.  Last Mammogram: 2/20222 The patient does want a mammogram this year.    Colon Cancer Screening:  Age 33-75 yo - benefits outweigh the risk. Adults 67-85 yo who have never been screened benefit.  Benefits: 134000 people in 2016 will be diagnosed and 49,000 will die - early detection helps [place]: Complications 2/2 to colonoscopy High Risk (Colonoscopy): genetic disorder (Lynch syndrome or familial adenomatous polyposis), personal hx of IBD, previous adenomatous polyp, or previous colorectal cancer, FamHx start 10 years before the age at diagnosis, increased in males and black race  Options:  FIT - looks for hemoglobin (blood in the stool) - specific and fairly sensitive - must be done annually Cologuard - looks for DNA and blood - more sensitive - therefore can have more false positives, every 3 years Colonoscopy - every 10 years if normal - sedation, bowl prep, must have someone drive you  Shared decision making and the patient had decided to do colonoscopy - 2016.   Social History    Tobacco Use  Smoking Status Never  Smokeless Tobacco Never    Lung Cancer Screening (Ages 31-51): not applicable   Weight Wt Readings from Last 3 Encounters:  05/30/21 152 lb 2 oz (69 kg)  11/15/20 153 lb 8 oz (69.6 kg)  05/03/20 154 lb 9.6 oz (70.1 kg)   Patient has normal BMI  BMI Readings from Last 1 Encounters:  05/30/21 26.11 kg/m     Chronic disease screening Blood pressure monitoring:  BP Readings from Last 3 Encounters:  05/30/21 118/80  11/15/20 110/80  05/03/20 120/83    Lipid Monitoring: Indication for screening: age >29, obesity, diabetes, family hx, CV risk factors.  Lipid screening: Yes  Lab Results  Component Value Date   CHOL 226 (H) 05/03/2020   HDL 51 05/03/2020   LDLCALC 149 (H) 05/03/2020   TRIG 135 05/03/2020   CHOLHDL 4.4 05/03/2020     Diabetes Screening: age >16, overweight, family hx, PCOS, hx of gestational diabetes, at risk ethnicity Diabetes Screening screening: Not Indicated  No results found for: HGBA1C   Past Medical History:  Diagnosis Date   COVID-19 08/2020   Dysplastic nevus 09/03/2019   Left hip/flank above waistline. Moderate atypia, limited margins free.    HSV-2 (herpes simplex virus 2) infection 2006   Vitamin D deficiency     Past Surgical History:  Procedure Laterality Date   CERVICAL BIOPSY  W/ LOOP ELECTRODE EXCISION  1996   DYSPLASIA  Prior to Admission medications   Medication Sig Start Date End Date Taking? Authorizing Provider  albuterol (VENTOLIN HFA) 108 (90 Base) MCG/ACT inhaler Inhale into the lungs. 08/15/18  Yes [provider]  ALPRAZolam (XANAX) 0.25 MG tablet TAKE 1 TABLET BY MOUTH AT BEDTIME AS NEEDED FOR ANXIETY 05/22/21  Yes Lesleigh Noe, MD  ibuprofen (ADVIL) 200 MG tablet Take 1 tablet (200 mg total) by mouth every 6 (six) hours as needed. 05/03/20  Yes Tamela Gammon, NP  Multiple Vitamins-Minerals (WOMENS 50+ MULTI VITAMIN/MIN PO) Take by mouth.   Yes [provider]    No Known Allergies   Gynecologic History: No LMP recorded. Patient is perimenopausal.  Obstetric History: G2P1011  Social History   Socioeconomic History   Marital status: Legally Separated    Spouse name: Not on file   Number of children: 1   Years of education: Hotel manager college   Highest education level: Not on file  Occupational History   Not on file  Tobacco Use   Smoking status: Never   Smokeless tobacco: Never  Vaping Use   Vaping Use: Never used  Substance and Sexual Activity   Alcohol use: Yes    Comment: 4 drinks a week   Drug use: No   Sexual activity: Not Currently    Partners: Male    Birth control/protection: Other-see comments, Abstinence    Comment: vasectomy  Other Topics Concern   Not on file  Social History Narrative   09/30/19   From: [place]   Living: with daughter   Work: Cabin crew with [employer]      Family: Daughter - Multimedia programmer (2003) - good relationship       Enjoys: travel, got to the beach/mountains      Exercise: not currently, achy knees   Diet: working on healthy eating now      Land belts: Yes    Guns: No   Safe in relationships: Yes    Social Determinants of Radio broadcast assistant Strain: Not on file  Food Insecurity: Not on file  Transportation Needs: Not on file  Physical Activity: Not on file  Stress: Not on file  Social Connections: Not on file  Intimate Partner Violence: Not on file    Family History  Problem Relation Age of Onset   Aneurysm Mother        DESEACED AT 76.Marland Kitchen BRAIN   Hypertension Mother    Stroke Father        DECEASED AT 57 , [place]   Alcohol abuse Father    Hypertension Maternal Aunt    Hypertension Maternal Grandmother    Breast cancer Maternal Grandmother        diagnosed in her 4's   Hypertension Maternal Grandfather    Cancer Maternal Grandfather        unsure, brain?   Alcohol abuse Brother     Review of Systems  Constitutional:  Negative for chills and  fever.  HENT:  Negative for congestion and sore throat.   Eyes:  Negative for blurred vision and double vision.  Respiratory:  Negative for shortness of breath.   Cardiovascular:  Negative for chest pain.  Gastrointestinal:  Negative for heartburn, nausea and vomiting.  Genitourinary: Negative.   Musculoskeletal: Negative.  Negative for myalgias.  Skin:  Negative for rash.  Neurological:  Negative for dizziness and headaches.  Endo/Heme/Allergies:  Does not bruise/bleed easily.  Psychiatric/Behavioral:  Negative for depression. The patient is not nervous/anxious.  Physical Exam BP 118/80   Pulse 76   Temp (!) 97.1 F (36.2 C) (Temporal)   Ht _0  (1.626 m)   Wt 152 lb 2 oz (69 kg)   SpO2 96%   BMI 26.11 kg/m    BP Readings from Last 3 Encounters:  05/30/21 118/80  11/15/20 110/80  05/03/20 120/83      Physical Exam Constitutional:      General: She is not in acute distress.    Appearance: She is well-developed. She is not diaphoretic.  HENT:     Head: Normocephalic and atraumatic.     Right Ear: External ear normal.     Left Ear: External ear normal.     Nose: Nose normal.  Eyes:     General: No scleral icterus.    Extraocular Movements: Extraocular movements intact.     Conjunctiva/sclera: Conjunctivae normal.  Cardiovascular:     Rate and Rhythm: Normal rate and regular rhythm.     Heart sounds: No murmur heard. Pulmonary:     Effort: Pulmonary effort is normal. No respiratory distress.     Breath sounds: Normal breath sounds. No wheezing.  Abdominal:     General: Bowel sounds are normal. There is no distension.     Palpations: Abdomen is soft. There is no mass.     Tenderness: There is no abdominal tenderness. There is no guarding or rebound.  Musculoskeletal:        General: Normal range of motion.     Cervical back: Neck supple.  Lymphadenopathy:     Cervical: No cervical adenopathy.  Skin:    General: Skin is warm and dry.     Capillary Refill:  Capillary refill takes less than 2 seconds.  Neurological:     Mental Status: She is alert and oriented to person, place, and time.     Deep Tendon Reflexes: Reflexes normal.  Psychiatric:        Mood and Affect: Mood normal.        Behavior: Behavior normal.    Results:  PHQ-9:  [place] Office Visit from 05/30/2021 in [place] at [place]  PHQ-9 Total Score 8         Assessment: 55 y.o. G27P1011 female here for routine annual physical examination.  Plan: Problem List Items Addressed This Visit       Cardiovascular and Mediastinum   Hot flashes due to menopause     Digestive   Tubular adenoma of colon   Relevant Orders   Ambulatory referral to Gastroenterology     Other   Hyperlipidemia   Relevant Orders   Comprehensive metabolic panel   Lipid panel   Other Visit Diagnoses     Annual physical exam    -  Primary   Relevant Orders   CBC   TSH   Encounter for screening colonoscopy       Relevant Orders   Ambulatory referral to Gastroenterology   Vitamin D deficiency       Relevant Orders   Vitamin D, 25-hydroxy       Screening: -- Blood pressure screen normal -- cholesterol screening: will obtain -- Weight screening: normal -- Diabetes Screening: will obtain -- Nutrition: Encouraged healthy diet  The 10-year ASCVD risk score (Arnett DK, et al., 2019) is: 2.5%   Values used to calculate the score:     Age: 20 years     Sex: Female     Is Non-Hispanic African American: No  Diabetic: No     Tobacco smoker: No     Systolic Blood Pressure: 199 mmHg     Is BP treated: No     HDL Cholesterol: 51 mg/dL     Total Cholesterol: 226 mg/dL  -- Statin therapy for Age 61-75 with CVD risk >7.5%  Psych -- Depression screening (PHQ-9):  [place] Office Visit from 05/30/2021 in [facility] at [place]  PHQ-9 Total Score 8        Safety -- tobacco screening: not using -- alcohol screening:  low-risk usage. -- no  evidence of domestic violence or intimate partner violence.   Cancer Screening -- pap smear not collected per ASCCP guidelines -- family history of breast cancer screening: done. not at high risk. -- Mammogram -  will schedule with ob -- Colon cancer (age 80+)--  referral to GI to evaluate   Immunizations Immunization History  Administered Date(s) Administered   Influenza Split 09/06/2011   Influenza,inj,Quad PF,6+ Mos 09/14/2016    -- flu vaccine declined -- TDAP q10 years unknown, record requested -- Shingles (age >50) not up to date - advised getting -- Covid-19 Vaccine not up to date - declined   Encouraged healthy diet and exercise. Encouraged regular vision and dental care.    Lesleigh Noe, MD

## 2021-05-30 NOTE — Patient Instructions (Addendum)
[facility] - for bio-identical hormones   Shingles - may cause 1-2 days of feeling tired, recommend, 2 part vaccine  Tdap - would recommend every 10 years

## 2021-06-20 ENCOUNTER — Ambulatory Visit: Payer: Self-pay | Admitting: Dermatology

## 2021-06-20 ENCOUNTER — Ambulatory Visit (INDEPENDENT_AMBULATORY_CARE_PROVIDER_SITE_OTHER): Payer: Self-pay | Admitting: Dermatology

## 2021-06-20 ENCOUNTER — Other Ambulatory Visit: Payer: Self-pay

## 2021-06-20 DIAGNOSIS — L988 Other specified disorders of the skin and subcutaneous tissue: Secondary | ICD-10-CM

## 2021-06-20 NOTE — Progress Notes (Signed)
   Follow-Up Visit   Subjective  LAKEN ROG is a 55 y.o. female who presents for the following: Facial Elastosis (Patient is here today for Botox and fillers. ).  The following portions of the chart were reviewed this encounter and updated as appropriate:   Tobacco  Allergies  Meds  Problems  Med Hx  Surg Hx  Fam Hx     Review of Systems:  No other skin or systemic complaints except as noted in HPI or Assessment and Plan.  Objective  Well appearing patient in no apparent distress; mood and affect are within normal limits.  A focused examination was performed including the face. Relevant physical exam findings are noted in the Assessment and Plan.  Face Rhytides and volume loss.                       Assessment & Plan  Elastosis of skin Face  Botox 40 units injected as marked - Frown complex 20 units  - Forehead 5 units - Crow's feet 7.5 units each for a total of 15 units  Restylane Refyne injected as marked - B/L oral commisures - Lines below the oral commissures  - R cheek (flat area) - Scarring of the B/L cheeks - Ant chin crease  Botox Injection - Face Location: See attached image  Informed consent: Discussed risks (infection, pain, bleeding, bruising, swelling, allergic reaction, paralysis of nearby muscles, eyelid droop, double vision, neck weakness, difficulty breathing, headache, undesirable cosmetic result, and need for additional treatment) and benefits of the procedure, as well as the alternatives.  Informed consent was obtained.  Preparation: The area was cleansed with alcohol.  Procedure Details:  Botox was injected into the dermis with a 30-gauge needle. Pressure applied to any bleeding. Ice packs offered for swelling.  Lot Number:  [id number] Expiration:  02/2023  Total Units Injected:  40  Plan: Patient was instructed to remain upright for 4 hours. Patient was instructed to avoid massaging the face and avoid vigorous  exercise for the rest of the day. Tylenol may be used for headache.  Allow 2 weeks before returning to clinic for additional dosing as needed. Patient will call for any problems.  Filling material injection - Face  Prior to the procedure, the patient's past medical history, allergies and the rare but potential risks and complications were reviewed with the patient and a signed consent was obtained. Pre and post-treatment care was discussed and instructions provided.  Location: oral commissures; corners of mouth; indented/pitted scars of cheeks; anterior chin crease; inferior nasolabials.  Filler Type: Restylane refyne  Lot [id number]  Exp date 12/24/2021  Procedure: The area was prepped thoroughly with Puracyn. After introducing the needle into the desired treatment area, the syringe plunger was drawn back to ensure there was no flash of blood prior to injecting the filler in order to minimize risk of intravascular injection and vascular occlusion. After injection of the filler, the treated areas were cleansed and iced to reduce swelling. Post-treatment instructions were reviewed with the patient.       Patient tolerated the procedure well. The patient will call with any problems, questions or concerns prior to their next appointment.  Return in about 4 months (around 10/21/2021) for Botox.  Luther Redo, CMA, am acting as scribe for Sarina Ser, MD . Documentation: I have reviewed the above documentation for accuracy and completeness, and I agree with the above.  Sarina Ser, MD

## 2021-06-21 ENCOUNTER — Encounter: Payer: Self-pay | Admitting: Dermatology

## 2021-06-21 NOTE — Patient Instructions (Signed)

## 2021-08-01 ENCOUNTER — Other Ambulatory Visit: Payer: Self-pay | Admitting: Family Medicine

## 2021-08-01 DIAGNOSIS — E559 Vitamin D deficiency, unspecified: Secondary | ICD-10-CM

## 2021-08-03 ENCOUNTER — Other Ambulatory Visit: Payer: Self-pay | Admitting: Dermatology

## 2021-09-18 ENCOUNTER — Other Ambulatory Visit: Payer: Self-pay

## 2021-09-18 ENCOUNTER — Encounter: Payer: Self-pay | Admitting: Physician Assistant

## 2021-09-18 ENCOUNTER — Ambulatory Visit (INDEPENDENT_AMBULATORY_CARE_PROVIDER_SITE_OTHER): Payer: 59

## 2021-09-18 ENCOUNTER — Ambulatory Visit: Payer: 59 | Admitting: Physician Assistant

## 2021-09-18 ENCOUNTER — Ambulatory Visit: Payer: Self-pay | Admitting: *Deleted

## 2021-09-18 VITALS — Ht 65.0 in | Wt 152.0 lb

## 2021-09-18 DIAGNOSIS — M545 Low back pain, unspecified: Secondary | ICD-10-CM

## 2021-09-18 DIAGNOSIS — G8929 Other chronic pain: Secondary | ICD-10-CM | POA: Diagnosis not present

## 2021-09-18 MED ORDER — METHYLPREDNISOLONE 4 MG PO TBPK: ORAL_TABLET | ORAL | 0 refills | Status: DC

## 2021-09-18 NOTE — Telephone Encounter (Signed)
Opened chart to answer a question for the agent.   No triage needed.   She's going to contact her PCP or ortho dr.

## 2021-09-18 NOTE — Progress Notes (Signed)
Office Visit Note   Patient: Tina Allen           Date of Birth: Nov 20, 1965           MRN: [id number] Visit Date: 09/18/2021              Requested by: Lesleigh Noe, MD [place],  [place] [address] PCP: Lesleigh Noe, MD  Chief Complaint  Patient presents with   Lower Back - Pain      HPI: Tina Allen is a pleasant 56 year old woman with a 3-week history of sciatic left buttock pain that radiates down in her leg.  She denies any groin or hip pain.  This began after stretching and twisting in a Pilates class.  She had immediate pulling sensation.  She dry describes the pain when she turns certain way as "a lightening bolt "no history of problems in the past she is quite active and healthy.  She has tried taking Naprosyn without much relief.  She denies any weakness paresthesia or numbing in her feet.  No loss of bowel or bladder control  Assessment & Plan: Visit Diagnoses:  Low back pain  Plan: Had a long discussion with the patient today.  I think she would do well with a steroid taper if this can help with her inflammation.  We will call that into the pharmacy today.  Also given her back exercises she can try doing recommended massage.  She will stop taking the naproxen while she is taking the steroid.  She has no focal weakness I am hopeful that this will get better.  If she has significant increase in symptoms she is to contact us immediately.  Otherwise she will follow-up in a month but may cancel this appointment if she is doing better.  Obviously if she failed conservative treatment we could consider an MRI  Follow-Up Instructions: No follow-ups on file.   Ortho Exam  Patient is alert, oriented, no adenopathy, well-dressed, normal affect, normal respiratory effort. Examination of her lower back does not demonstrate any palpable abnormalities or step-offs.  She does have increased pain with bending to the left.  Pain is not modified by bending forward  backward or bending right.  Deep tendon reflexes are equal.  Strength in her lower extremities is 5+ bilaterally in ankle knee and hip flexion extension.  She has a negative straight leg raise.  No sensory deficits  Imaging: XR Lumbar Spine 2-3 Views  Result Date: 09/18/2021 2 views of her lumbar spine demonstrate no listhesis well-maintained joint spacing no acute osseous injuries.  No significant sclerotic areas may be at small amount of anterior spurring  No images are attached to the encounter.  Labs: Lab Results  Component Value Date   LABORGA NO GROWTH 09/14/2016     Lab Results  Component Value Date   ALBUMIN 4.5 05/30/2021   ALBUMIN 4.6 11/15/2020   ALBUMIN 3.9 06/14/2018    No results found for: MG Lab Results  Component Value Date   VD25OH 29.40 (L) 05/30/2021   VD25OH 27.13 (L) 11/15/2020   VD25OH 35 05/03/2020    No results found for: PREALBUMIN CBC EXTENDED Latest Ref Rng & Units 05/30/2021 11/15/2020 05/03/2020  WBC 4.0 - 10.5 K/uL 5.0 7.5 5.2  RBC 3.87 - 5.11 Mil/uL 4.09 4.21 4.34  HGB 12.0 - 15.0 g/dL 13.5 14.0 14.3  HCT 36.0 - 46.0 % 39.6 40.7 42.8  PLT 150.0 - 400.0 K/uL 217.0 236.0 233  NEUTROABS 1,500 - 7,800 cells/uL - - 2,454  LYMPHSABS 850 - 3,900 cells/uL - - 2,220     Body mass index is 25.29 kg/m.  Orders:  Orders Placed This Encounter  Procedures   XR Lumbar Spine 2-3 Views   Meds ordered this encounter  Medications   methylPREDNISolone (MEDROL DOSEPAK) 4 MG TBPK tablet    Sig: Take as directed on package.    Dispense:  21 tablet    Refill:  0     Procedures: No procedures performed  Clinical Data: No additional findings.  ROS:  All other systems negative, except as noted in the HPI. Review of Systems  Objective: Vital Signs: Ht 5\' 5"  (1.651 m)    Wt 152 lb (68.9 kg)    BMI 25.29 kg/m   Specialty Comments:  No specialty comments available.  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 Moderate atypia, limited margins free.    HSV-2 (herpes simplex virus 2) infection 2006   Vitamin D deficiency     Family History  Problem Relation Age of Onset   Aneurysm Mother        DESEACED AT 57.Marland Kitchen BRAIN   Hypertension Mother    Stroke Father        DECEASED AT 49 , [place]   Alcohol abuse Father    Hypertension Maternal Aunt    Hypertension Maternal Grandmother    Breast cancer Maternal Grandmother        diagnosed in her 53's   Hypertension Maternal Grandfather    Cancer Maternal Grandfather        unsure, brain?   Alcohol abuse Brother     Past Surgical History:  Procedure Laterality Date   CERVICAL BIOPSY  W/ LOOP ELECTRODE EXCISION  1996   DYSPLASIA   Social History   Occupational History   Not on file  Tobacco Use   Smoking status: Never   Smokeless tobacco: Never  Vaping Use   Vaping Use: Never used  Substance and Sexual Activity   Alcohol use: Yes    Comment: 4 drinks a week   Drug use: No   Sexual activity: Not Currently    Partners: Male    Birth control/protection: Other-see comments, Abstinence    Comment: vasectomy

## 2021-10-17 ENCOUNTER — Ambulatory Visit: Payer: 59 | Admitting: Orthopaedic Surgery

## 2021-10-24 ENCOUNTER — Ambulatory Visit: Payer: 59 | Admitting: Dermatology

## 2021-10-27 ENCOUNTER — Ambulatory Visit (INDEPENDENT_AMBULATORY_CARE_PROVIDER_SITE_OTHER): Payer: 59 | Admitting: Family Medicine

## 2021-10-27 ENCOUNTER — Other Ambulatory Visit: Payer: Self-pay

## 2021-10-27 VITALS — BP 118/80 | HR 71 | Temp 98.5°F | Ht 65.0 in | Wt 150.2 lb

## 2021-10-27 DIAGNOSIS — F418 Other specified anxiety disorders: Secondary | ICD-10-CM | POA: Diagnosis not present

## 2021-10-27 DIAGNOSIS — Z1211 Encounter for screening for malignant neoplasm of colon: Secondary | ICD-10-CM | POA: Diagnosis not present

## 2021-10-27 DIAGNOSIS — N951 Menopausal and female climacteric states: Secondary | ICD-10-CM

## 2021-10-27 DIAGNOSIS — J069 Acute upper respiratory infection, unspecified: Secondary | ICD-10-CM

## 2021-10-27 DIAGNOSIS — F411 Generalized anxiety disorder: Secondary | ICD-10-CM | POA: Diagnosis not present

## 2021-10-27 MED ORDER — ESTRADIOL 0.5 MG PO TABS: 0.5000 mg | ORAL_TABLET | Freq: Every day | ORAL | 3 refills | Status: DC

## 2021-10-27 MED ORDER — ALPRAZOLAM 0.25 MG PO TABS: ORAL_TABLET | ORAL | 1 refills | Status: DC

## 2021-10-27 MED ORDER — MEDROXYPROGESTERONE ACETATE 5 MG PO TABS: ORAL_TABLET | ORAL | 3 refills | Status: DC

## 2021-10-27 NOTE — Assessment & Plan Note (Signed)
This is controlled with rare use of Xanax, refill provided.  She does note some improvement in anxiety with treatment of menopausal symptoms so hopefully will not need as much Xanax.  Currently using about 60 tablets/year ?

## 2021-10-27 NOTE — Progress Notes (Signed)
? ?Subjective:  ? ?  ?Tina Allen is a 56 y.o. female presenting for Sore Throat (Started last night. Several people in her office have symptoms but no covid cases. ), Nasal Congestion, and Medication Refill ?  ? ? ?Sore Throat  ?This is a new problem. The current episode started yesterday. The problem has been gradually worsening. There has been no fever. Associated symptoms include congestion, coughing and headaches. Pertinent negatives include no ear pain or shortness of breath. She has had no exposure to strep or mono. She has tried nothing for the symptoms. The treatment provided no relief.  ?Medication Refill ?Associated symptoms include congestion, coughing and headaches. Pertinent negatives include no chills or fever.  ? ?#menopause ?- hormone replacement ?- mood is better ? ?Anxiety ?- doing well ?- has improved with treatment of menopause symptom ? ? ? ?Review of Systems  ?Constitutional:  Negative for chills and fever.  ?HENT:  Positive for congestion, postnasal drip and sinus pressure. Negative for ear pain.   ?Respiratory:  Positive for cough. Negative for shortness of breath.   ?Neurological:  Positive for headaches.  ? ? ?Social History  ? ?Tobacco Use  ?Smoking Status Never  ?Smokeless Tobacco Never  ? ? ? ?   ?Objective:  ?  ?BP Readings from Last 3 Encounters:  ?10/27/21 118/80  ?05/30/21 118/80  ?11/15/20 110/80  ? ?Wt Readings from Last 3 Encounters:  ?10/27/21 150 lb 4 oz (68.2 kg)  ?09/18/21 152 lb (68.9 kg)  ?05/30/21 152 lb 2 oz (69 kg)  ? ? ?BP 118/80   Pulse 71   Temp 98.5 ?F (36.9 ?C) (Oral)   Ht 5\' 5"  (1.651 m)   Wt 150 lb 4 oz (68.2 kg)   SpO2 97%   BMI 25.00 kg/m?  ? ? ?Physical Exam ?Constitutional:   ?   General: She is not in acute distress. ?   Appearance: She is well-developed. She is not diaphoretic.  ?HENT:  ?   Head: Normocephalic and atraumatic.  ?   Right Ear: Tympanic membrane and ear canal normal.  ?   Left Ear: Tympanic membrane and ear canal normal.  ?   Nose:  Mucosal edema and rhinorrhea present.  ?   Right Sinus: No maxillary sinus tenderness or frontal sinus tenderness.  ?   Left Sinus: No maxillary sinus tenderness or frontal sinus tenderness.  ?   Mouth/Throat:  ?   Pharynx: Uvula midline. Posterior oropharyngeal erythema present. No oropharyngeal exudate.  ?   Tonsils: 0 on the right. 0 on the left.  ?Eyes:  ?   General: No scleral icterus. ?   Conjunctiva/sclera: Conjunctivae normal.  ?Cardiovascular:  ?   Rate and Rhythm: Normal rate and regular rhythm.  ?   Heart sounds: Normal heart sounds. No murmur heard. ?Pulmonary:  ?   Effort: Pulmonary effort is normal. No respiratory distress.  ?   Breath sounds: Normal breath sounds.  ?Musculoskeletal:  ?   Cervical back: Neck supple.  ?Lymphadenopathy:  ?   Cervical: No cervical adenopathy.  ?Skin: ?   General: Skin is warm and dry.  ?   Capillary Refill: Capillary refill takes less than 2 seconds.  ?Neurological:  ?   Mental Status: She is alert.  ? ? ? ? ? ?   ?Assessment & Plan:  ? ?Problem List Items Addressed This Visit   ? ?  ? Cardiovascular and Mediastinum  ? Hot flashes due to menopause - Primary  ?  She notes significant improvement in hot flashes with starting hormone replacement.  Her OB/GYN started the replacement she would like me to continue refills provided.  Discussed checking in in 2 to 3 years to consider tapering at that time.  Blood pressure is normal. ?  ?  ? Relevant Medications  ? medroxyPROGESTERone (PROVERA) 5 MG tablet  ? estradiol (ESTRACE) 0.5 MG tablet  ?  ? Other  ? Depression with anxiety  ?  This is controlled with rare use of Xanax, refill provided.  She does note some improvement in anxiety with treatment of menopausal symptoms so hopefully will not need as much Xanax.  Currently using about 60 tablets/year ?  ?  ? Relevant Medications  ? ALPRAZolam (XANAX) 0.25 MG tablet  ? ?Other Visit Diagnoses   ? ? Anxiety state      ? Relevant Medications  ? ALPRAZolam (XANAX) 0.25 MG tablet  ?  Screening for colon cancer      ? Relevant Orders  ? Ambulatory referral to Gastroenterology  ? Viral URI      ? ?  ? ?Discussed symptoms consistent with a virus with only 1 day of symptoms.  She notes several work people at our office who tested negative for COVID suspect she has the same thing.  Encourage COVID testing if symptoms worsen.  Discussed if symptoms not improved by next week reasonable to consider antibiotics but at this time would not recommend it.  Encouraged over-the-counter treatment as needed. ? ?Return in about 1 year (around 10/28/2022) for annual physical. ? ?Lesleigh Noe, MD ? ?This visit occurred during the SARS-CoV-2 public health emergency.  Safety protocols were in place, including screening questions prior to the visit, additional usage of staff PPE, and extensive cleaning of exam room while observing appropriate contact time as indicated for disinfecting solutions.  ? ?

## 2021-10-27 NOTE — Patient Instructions (Signed)
Based on your symptoms, it looks like you have a virus.   Antibiotics are not need for a viral infection but the following will help:   Drink plenty of fluids Get lots of rest  Sinus Congestion 1) Neti Pot (Saline rinse) -- 2 times day -- if tolerated 2) Flonase (Store Brand ok) - once daily 3) Over the counter congestion medications  Cough 1) Cough drops can be helpful 2) Nyquil (or nighttime cough medication) 3) Honey is proven to be one of the best cough medications  4) Cough medicine with Dextromethorphan can also be helpful  Sore Throat 1) Honey as above, cough drops 2) Ibuprofen or Aleve can be helpful 3) Salt water Gargles  If you develop fevers (Temperature >100.4), chills, worsening symptoms or symptoms lasting longer than 10 days return to clinic.    

## 2021-10-27 NOTE — Assessment & Plan Note (Signed)
She notes significant improvement in hot flashes with starting hormone replacement.  Her OB/GYN started the replacement she would like me to continue refills provided.  Discussed checking in in 2 to 3 years to consider tapering at that time.  Blood pressure is normal. ?

## 2021-10-30 ENCOUNTER — Other Ambulatory Visit: Payer: Self-pay

## 2021-10-30 DIAGNOSIS — Z86010 Personal history of colonic polyps: Secondary | ICD-10-CM

## 2021-10-30 MED ORDER — PEG 3350-KCL-NA BICARB-NACL 420 G PO SOLR: 4000.0000 mL | Freq: Once | ORAL | 0 refills | Status: AC

## 2021-10-30 NOTE — Progress Notes (Signed)
Gastroenterology Pre-Procedure Review ? ?Request Date: 01/15/2022 ?Requesting Physician: Dr. Marius Ditch ? ?PATIENT REVIEW QUESTIONS: The patient responded to the following health history questions as indicated:   ? ?1. Are you having any GI issues? yes (Constipation, she does need to see provider first.) ?2. Do you have a personal history of Polyps? yes (last colonoscopy 2016- polyps removed.) ?3. Do you have a family history of Colon Cancer or Polyps? no ?4. Diabetes Mellitus? no ?5. Joint replacements in the past 12 months?no ?6. Major health problems in the past 3 months?no ?7. Any artificial heart valves, MVP, or defibrillator?no ?   ?MEDICATIONS & ALLERGIES:    ?Patient reports the following regarding taking any anticoagulation/antiplatelet therapy:   ?Plavix, Coumadin, Eliquis, Xarelto, Lovenox, Pradaxa, Brilinta, or Effient? no ?Aspirin? no ? ?Patient confirms/reports the following medications:  ?Current Outpatient Medications  ?Medication Sig Dispense Refill  ? albuterol (VENTOLIN HFA) 108 (90 Base) MCG/ACT inhaler Inhale into the lungs.    ? ALPRAZolam (XANAX) 0.25 MG tablet TAKE 1 TABLET BY MOUTH AT BEDTIME AS NEEDED FOR ANXIETY 30 tablet 1  ? estradiol (ESTRACE) 0.5 MG tablet Take 1 tablet (0.5 mg total) by mouth daily. 90 tablet 3  ? ibuprofen (ADVIL) 200 MG tablet Take 1 tablet (200 mg total) by mouth every 6 (six) hours as needed. 30 tablet 3  ? LATISSE 0.03 % ophthalmic solution PLACE 1 APPLICATION INTO BOTH EYES AS DIRECTED. PLACE ONE DROP ON APPLICATOR AND APPLY EVENLY ALONG THE SKIN OF THE UPPER EYELID AT BASE OF EYELASHES ONCE DAILY AT BEDTIME REPEAT PROCEDURE FOR SECOND EYE (USE A CLEAN APPLICATOR). 5 mL 3  ? medroxyPROGESTERone (PROVERA) 5 MG tablet Take one tablet by mouth daily on days 1-12 of each month. 45 tablet 3  ? Multiple Vitamins-Minerals (WOMENS 50+ MULTI VITAMIN/MIN PO) Take by mouth.    ? ?No current facility-administered medications for this visit.  ? ? ?Patient confirms/reports the  following allergies:  ?No Known Allergies ? ?No orders of the defined types were placed in this encounter. ? ? ?AUTHORIZATION INFORMATION ?Primary Insurance: ?1D#: ?Group #: ? ?Secondary Insurance: ?1D#: ?Group #: ? ?SCHEDULE INFORMATION: ?Date: 01/15/2022 ?Time: ?Location: [facility] ? ?

## 2021-11-21 ENCOUNTER — Ambulatory Visit: Payer: 59 | Admitting: Dermatology

## 2021-11-21 ENCOUNTER — Other Ambulatory Visit: Payer: Self-pay

## 2021-11-21 DIAGNOSIS — L821 Other seborrheic keratosis: Secondary | ICD-10-CM

## 2021-11-21 DIAGNOSIS — L578 Other skin changes due to chronic exposure to nonionizing radiation: Secondary | ICD-10-CM

## 2021-11-21 DIAGNOSIS — L820 Inflamed seborrheic keratosis: Secondary | ICD-10-CM | POA: Diagnosis not present

## 2021-11-21 DIAGNOSIS — L988 Other specified disorders of the skin and subcutaneous tissue: Secondary | ICD-10-CM

## 2021-11-21 NOTE — Progress Notes (Signed)
? ?Follow-Up Visit ?  ?Subjective  ?Tina Allen is a 56 y.o. female who presents for the following: Facial Elastosis (Patient is here today for Botox injections.) and Irregular skin lesion (R brow and R arm - changing, irregular appearing, patient is concerned about lesions since the women who does her facials mentioned melanoma regarding the spot on her R brow. ). ?The patient has spots, moles and lesions to be evaluated, some may be new or changing and the patient has concerns that these could be cancer. ? ?The following portions of the chart were reviewed this encounter and updated as appropriate:  ? Tobacco  Allergies  Meds  Problems  Med Hx  Surg Hx  Fam Hx   ?  ?Review of Systems:  No other skin or systemic complaints except as noted in HPI or Assessment and Plan. ? ?Objective  ?Well appearing patient in no apparent distress; mood and affect are within normal limits. ? ?A focused examination was performed including the face. Relevant physical exam findings are noted in the Assessment and Plan. ? ?Face ?Rhytides and volume loss.  ? ? ? ? ?R med eyebrow, R med bicep, L breast x 3 (5) ?Erythematous stuck-on, waxy papule or plaque ? ? ?Assessment & Plan  ?Elastosis of skin ?Face ? ?Botox 40 units injected as marked ?- Frown complex 20 units  ?- Forehead 5 units ?- Crow's feet 7.5 units each for a total of 15 units ? ?Botox Injection - Face ?Location: See attached image ? ?Informed consent: Discussed risks (infection, pain, bleeding, bruising, swelling, allergic reaction, paralysis of nearby muscles, eyelid droop, double vision, neck weakness, difficulty breathing, headache, undesirable cosmetic result, and need for additional treatment) and benefits of the procedure, as well as the alternatives.  Informed consent was obtained. ? ?Preparation: The area was cleansed with alcohol. ? ?Procedure Details:  Botox was injected into the dermis with a 30-gauge needle. Pressure applied to any bleeding. Ice packs  offered for swelling. ? ?Lot Number:  [id number] ?Expiration:  10/2023 ? ?Total Units Injected:  40 ? ?Plan: Patient was instructed to remain upright for 4 hours. Patient was instructed to avoid massaging the face and avoid vigorous exercise for the rest of the day. Tylenol may be used for headache.  Allow 2 weeks before returning to clinic for additional dosing as needed. Patient will call for any problems. ? ? ?Inflamed seborrheic keratosis (5) ?R med eyebrow, R med bicep, L breast x 3 ? ?Recheck at Botox follow up. ? ?Destruction of lesion - R med eyebrow, R med bicep, L breast x 3 ?Complexity: simple   ?Destruction method: cryotherapy   ?Informed consent: discussed and consent obtained   ?Timeout:  patient name, date of birth, surgical site, and procedure verified ?Lesion destroyed using liquid nitrogen: Yes   ?Region frozen until ice ball extended beyond lesion: Yes   ?Outcome: patient tolerated procedure well with no complications   ?Post-procedure details: wound care instructions given   ? ?Actinic Damage ?- chronic, secondary to cumulative UV radiation exposure/sun exposure over time ?- diffuse scaly erythematous macules with underlying dyspigmentation ?- Recommend daily broad spectrum sunscreen SPF 30+ to sun-exposed areas, reapply every 2 hours as needed.  ?- Recommend staying in the shade or wearing long sleeves, sun glasses (UVA+UVB protection) and wide brim hats (4-inch brim around the entire circumference of the hat). ?- Call for new or changing lesions. ? ?Seborrheic Keratoses ?- Stuck-on, waxy, tan-brown papules and/or plaques  ?- Benign-appearing ?- Discussed  benign etiology and prognosis. ?- Observe ?- Call for any changes ? ?Return in about 3 months (around 02/21/2022) for Botox injections. ? ?I, Rudell Cobb, CMA, am acting as scribe for Sarina Ser, MD . ?Documentation: I have reviewed the above documentation for accuracy and completeness, and I agree with the above. ? ?Sarina Ser, MD ? ?

## 2021-11-21 NOTE — Patient Instructions (Addendum)
? ?[phone] [email or web address]     ?Dermatology & [place] [facility] [place] ? ? ? ?If You Need Anything After Your Visit ? ?If you have any questions or concerns for your doctor, please call our main line at [phone] and press option 4 to reach your doctor's medical assistant. If no one answers, please leave a voicemail as directed and we will return your call as soon as possible. Messages left after 4 pm will be answered the following business day.  ? ?You may also send [place] a message via MyChart. We typically respond to MyChart messages within 1-2 business days. ? ?For prescription refills, please ask your pharmacy to contact our office. Our fax number is [phone]. ? ?If you have an urgent issue when the clinic is closed that cannot wait until the next business day, you can page your doctor at the number below.   ? ?Please note that while we do our best to be available for urgent issues outside of office hours, we are not available 24/7.  ? ?If you have an urgent issue and are unable to reach [place], you may choose to seek medical care at your doctor's office, retail clinic, urgent care center, or emergency room. ? ?If you have a medical emergency, please immediately call 911 or go to the emergency department. ? ?Pager Numbers ? ?- Dr. Nehemiah Massed: [phone] ? ?- Dr. Laurence Ferrari: [phone] ? ?- Dr. Nicole Kindred: [phone] ? ?In the event of inclement weather, please call our main line at [phone] for an update on the status of any delays or closures. ? ?Dermatology Medication Tips: ?Please keep the boxes that topical medications come in in order to help keep track of the instructions about where and how to use these. Pharmacies typically print the medication instructions only on the boxes and not directly on the medication tubes.  ? ?If your medication is too expensive, please contact our office at [phone] option 4 or send [place] a message through [place].  ? ?We are unable to tell  what your co-pay for medications will be in advance as this is different depending on your insurance coverage. However, we may be able to find a substitute medication at lower cost or fill out paperwork to get insurance to cover a needed medication.  ? ?If a prior authorization is required to get your medication covered by your insurance company, please allow [place] 1-2 business days to complete this process. ? ?Drug prices often vary depending on where the prescription is filled and some pharmacies may offer cheaper prices. ? ?The website www.goodrx.com contains coupons for medications through different pharmacies. The prices here do not account for what the cost may be with help from insurance (it may be cheaper with your insurance), but the website can give you the price if you did not use any insurance.  ?- You can print the associated coupon and take it with your prescription to the pharmacy.  ?- You may also stop by our office during regular business hours and pick up a GoodRx coupon card.  ?- If you need your prescription sent electronically to a different pharmacy, notify our office through [facility] or by phone at [phone] option 4. ? ? ? ? ?Si Usted Necesita Algo Despu?s de Su Visita ? ?Tambi?n puede enviarnos un mensaje a trav?s de MyChart. Por lo general respondemos a los mensajes de MyChart en el transcurso de 1 a 2 d?as h?biles. ? ?Para renovar recetas, por favor pida a  su farmacia que se ponga en contacto con nuestra oficina. Nuestro n?mero de fax es el [phone]. ? ?Si tiene un asunto urgente cuando la cl?nica est? cerrada y que no puede esperar hasta el siguiente d?a h?bil, puede llamar/localizar a su doctor(a) al n?mero que aparece a continuaci?n.  ? ?Por favor, tenga en cuenta que aunque hacemos todo lo posible para estar disponibles para asuntos urgentes fuera del horario de oficina, no estamos disponibles las 24 horas del d?a, los 7 d?as de la semana.  ? ?Si tiene un problema  urgente y no puede comunicarse con nosotros, puede optar por buscar atenci?n m?dica  en el consultorio de su doctor(a), en una cl?nica privada, en un centro de atenci?n urgente o en una sala de emergencias. ? ?Si tiene Engineer, maintenance (IT) m?dica, por favor llame inmediatamente al 911 o vaya a la sala de emergencias. ? ?N?meros de b?per ? ?- Dr. Nehemiah Massed: [phone] ? ?- Dra. Moye: [phone] ? ?- Dra. Nicole Kindred: [phone] ? ?En caso de inclemencias del tiempo, por favor llame a nuestra l?nea principal al [phone] para una actualizaci?n sobre el estado de cualquier retraso o cierre. ? ?Consejos para la medicaci?n en dermatolog?a: ?Por favor, guarde las cajas en las que vienen los medicamentos de uso t?pico para ayudarle a seguir las instrucciones sobre d?nde y c?mo usarlos. Las farmacias generalmente imprimen las instrucciones del medicamento s?lo en las cajas y no directamente en los tubos del [place].  ? ?Si su medicamento es muy caro, por favor, p?ngase en contacto con Zigmund Daniel llamando al [phone] y presione la opci?n 4 o env?enos un mensaje a trav?s de MyChart.  ? ?No podemos decirle cu?l ser? su copago por los medicamentos por adelantado ya que esto es diferente dependiendo de la cobertura de su seguro. Sin embargo, es posible que podamos encontrar un medicamento sustituto a Electrical engineer un formulario para que el seguro cubra el medicamento que se considera necesario.  ? ?Si se requiere Ardelia Mems autorizaci?n previa para que su compa??a de seguros [place] su medicamento, por favor perm?tanos de 1 a 2 d?as h?biles para completar este proceso. ? ?Los precios de los medicamentos var?an con frecuencia dependiendo del Environmental consultant de d?nde se surte la receta y alguna farmacias pueden ofrecer precios m?s baratos. ? ?El sitio web www.goodrx.com tiene cupones para medicamentos de Airline pilot. Los precios aqu? no tienen en cuenta lo que podr?a costar con la ayuda del seguro (puede ser m?s barato con  su seguro), pero el sitio web puede darle el precio si no utiliz? ning?n seguro.  ?- Puede imprimir el cup?n correspondiente y llevarlo con su receta a la farmacia.  ?- Tambi?n puede pasar por nuestra oficina durante el horario de atenci?n regular y recoger una tarjeta de cupones de GoodRx.  ?- Si necesita que su receta se env?e electr?nicamente a Chiropodist, informe a nuestra oficina a trav?s de MyChart de [place] o por tel?fono llamando al [phone] y presione la opci?n 4. ? ?

## 2021-11-23 ENCOUNTER — Encounter: Payer: Self-pay | Admitting: Dermatology

## 2021-11-27 ENCOUNTER — Other Ambulatory Visit: Payer: Self-pay | Admitting: Nurse Practitioner

## 2021-11-29 ENCOUNTER — Other Ambulatory Visit (HOSPITAL_BASED_OUTPATIENT_CLINIC_OR_DEPARTMENT_OTHER): Payer: Self-pay | Admitting: *Deleted

## 2021-11-29 DIAGNOSIS — Z1231 Encounter for screening mammogram for malignant neoplasm of breast: Secondary | ICD-10-CM

## 2021-12-08 ENCOUNTER — Ambulatory Visit (HOSPITAL_BASED_OUTPATIENT_CLINIC_OR_DEPARTMENT_OTHER)
Admission: RE | Admit: 2021-12-08 | Discharge: 2021-12-08 | Disposition: A | Discharge status: Home / Self Care | Payer: 59 | Source: Ambulatory Visit | Attending: Family Medicine | Admitting: Family Medicine

## 2021-12-08 ENCOUNTER — Encounter (HOSPITAL_BASED_OUTPATIENT_CLINIC_OR_DEPARTMENT_OTHER): Payer: Self-pay | Admitting: Radiology

## 2021-12-08 DIAGNOSIS — Z1231 Encounter for screening mammogram for malignant neoplasm of breast: Secondary | ICD-10-CM | POA: Insufficient documentation

## 2022-01-15 ENCOUNTER — Ambulatory Visit: Payer: 59 | Admitting: Anesthesiology

## 2022-01-15 ENCOUNTER — Ambulatory Visit
Admission: RE | Admit: 2022-01-15 | Discharge: 2022-01-15 | Disposition: A | Discharge status: Home / Self Care | Payer: 59 | Attending: Gastroenterology | Admitting: Gastroenterology

## 2022-01-15 ENCOUNTER — Ambulatory Visit: Payer: 59 | Admitting: Certified Registered Nurse Anesthetist

## 2022-01-15 ENCOUNTER — Encounter: Payer: Self-pay | Admitting: Gastroenterology

## 2022-01-15 ENCOUNTER — Encounter
Admission: RE | Disposition: A | Discharge status: Home / Self Care | Payer: Self-pay | Source: Home / Self Care | Attending: Gastroenterology

## 2022-01-15 DIAGNOSIS — K644 Residual hemorrhoidal skin tags: Secondary | ICD-10-CM | POA: Diagnosis not present

## 2022-01-15 DIAGNOSIS — K648 Other hemorrhoids: Secondary | ICD-10-CM | POA: Insufficient documentation

## 2022-01-15 DIAGNOSIS — Z1211 Encounter for screening for malignant neoplasm of colon: Secondary | ICD-10-CM | POA: Diagnosis present

## 2022-01-15 DIAGNOSIS — F32A Depression, unspecified: Secondary | ICD-10-CM | POA: Insufficient documentation

## 2022-01-15 DIAGNOSIS — F419 Anxiety disorder, unspecified: Secondary | ICD-10-CM | POA: Insufficient documentation

## 2022-01-15 DIAGNOSIS — Z860100 Personal history of colon polyps, unspecified: Secondary | ICD-10-CM

## 2022-01-15 HISTORY — PX: COLONOSCOPY: SHX5424

## 2022-01-15 SURGERY — COLONOSCOPY
Anesthesia: General

## 2022-01-15 SURGERY — COLONOSCOPY WITH PROPOFOL
Anesthesia: General

## 2022-01-15 MED ORDER — PROPOFOL 500 MG/50ML IV EMUL
INTRAVENOUS | Status: DC | PRN
  Administered 2022-01-15: 120 ug/kg/min via INTRAVENOUS

## 2022-01-15 MED ORDER — LIDOCAINE HCL (PF) 2 % IJ SOLN
INTRAMUSCULAR | Status: AC
  Filled 2022-01-15: qty 5

## 2022-01-15 MED ORDER — GLYCOPYRROLATE 0.2 MG/ML IJ SOLN
INTRAMUSCULAR | Status: DC | PRN
  Administered 2022-01-15: .2 mg via INTRAVENOUS

## 2022-01-15 MED ORDER — LIDOCAINE HCL (CARDIAC) PF 100 MG/5ML IV SOSY
PREFILLED_SYRINGE | INTRAVENOUS | Status: DC | PRN
  Administered 2022-01-15: 50 mg via INTRAVENOUS

## 2022-01-15 MED ORDER — SODIUM CHLORIDE 0.9 % IV SOLN: INTRAVENOUS | Status: DC

## 2022-01-15 MED ORDER — PROPOFOL 10 MG/ML IV BOLUS
INTRAVENOUS | Status: DC | PRN
  Administered 2022-01-15: 100 mg via INTRAVENOUS
  Administered 2022-01-15: 40 mg via INTRAVENOUS

## 2022-01-15 MED ORDER — DEXMEDETOMIDINE HCL IN NACL 200 MCG/50ML IV SOLN
INTRAVENOUS | Status: DC | PRN
  Administered 2022-01-15: 8 ug via INTRAVENOUS
  Administered 2022-01-15: 12 ug via INTRAVENOUS

## 2022-01-15 NOTE — OR Nursing (Signed)
States she only took half of prep. Results no clear. Vanga spoke with patient. To reschedule to this afternoon. Dava Isley RN instructed patient on finishing prep, NPO requirements, and time to return. Discharged via ambulation.

## 2022-01-15 NOTE — Anesthesia Preprocedure Evaluation (Signed)
Anesthesia Evaluation  Patient identified by MRN, date of birth, ID band Patient awake    Reviewed: Allergy & Precautions, H&P , NPO status , Patient's Chart, lab work & pertinent test results, reviewed documented beta blocker date and time   History of Anesthesia Complications Negative for: history of anesthetic complications  Airway Mallampati: II  TM Distance: >3 FB Neck ROM: full    Dental  (+) Dental Advidsory Given, Caps, Teeth Intact   Pulmonary neg pulmonary ROS,    Pulmonary exam normal breath sounds clear to auscultation       Cardiovascular Exercise Tolerance: Good negative cardio ROS Normal cardiovascular exam Rhythm:regular Rate:Normal     Neuro/Psych PSYCHIATRIC DISORDERS Anxiety Depression negative neurological ROS     GI/Hepatic negative GI ROS, Neg liver ROS,   Endo/Other  negative endocrine ROS  Renal/GU negative Renal ROS  negative genitourinary   Musculoskeletal   Abdominal   Peds  Hematology negative hematology ROS (+)   Anesthesia Other Findings Past Medical History: 08/2020: COVID-19 09/03/2019: Dysplastic nevus     Comment:  Left hip/flank above waistline. Moderate atypia, limited              margins free.  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 No date: Vitamin D deficiency   Reproductive/Obstetrics negative OB ROS                             Anesthesia Physical Anesthesia Plan  ASA: 2  Anesthesia Plan: General   Post-op Pain Management:    Induction: Intravenous  PONV Risk Score and Plan: 3 and Propofol infusion and TIVA  Airway Management Planned: Natural Airway and Nasal Cannula  Additional Equipment:   Intra-op Plan:   Post-operative Plan:   Informed Consent: I have reviewed the patients History and Physical, chart, labs and discussed the procedure including the risks, benefits and alternatives for the proposed anesthesia with the  patient or authorized representative who has indicated his/her understanding and acceptance.     Dental Advisory Given  Plan Discussed with: Anesthesiologist, CRNA and Surgeon  Anesthesia Plan Comments:         Anesthesia Quick Evaluation

## 2022-01-15 NOTE — Op Note (Signed)
[facility] Gastroenterology Patient Name: Tina Allen Procedure Date: 01/15/2022 2:00 PM MRN: [id number] Account #: [id number] Date of Birth: 08-Jul-1966 Admit Type: Outpatient Age: 56 Room: [facility] ENDO ROOM 2 Gender: Female Note Status: Finalized Instrument Name: Jasper Riling [id number] Procedure:             Colonoscopy Indications:           Surveillance: Personal history of adenomatous polyps                         on last colonoscopy 5 years ago Providers:             Lin Landsman MD, MD Referring MD:          Jobe Marker. Einar Pheasant (Referring MD) Medicines:             General Anesthesia Complications:         No immediate complications. Estimated blood loss: None. Procedure:             Pre-Anesthesia Assessment:                        - Prior to the procedure, a History and Physical was                         performed, and patient medications and allergies were                         reviewed. The patient is competent. The risks and                         benefits of the procedure and the sedation options and                         risks were discussed with the patient. All questions                         were answered and informed consent was obtained.                         Patient identification and proposed procedure were                         verified by the physician, the nurse, the                         anesthesiologist, the anesthetist and the technician                         in the pre-procedure area in the procedure room in the                         endoscopy suite. Mental Status Examination: alert and                         oriented. Airway Examination: normal oropharyngeal                         airway and neck mobility. Respiratory Examination:  clear to auscultation. CV Examination: normal.                         Prophylactic Antibiotics: The patient does not require                         prophylactic  antibiotics. Prior Anticoagulants: The                         patient has taken no previous anticoagulant or                         antiplatelet agents. ASA Grade Assessment: II - A                         patient with mild systemic disease. After reviewing                         the risks and benefits, the patient was deemed in                         satisfactory condition to undergo the procedure. The                         anesthesia plan was to use general anesthesia.                         Immediately prior to administration of medications,                         the patient was re-assessed for adequacy to receive                         sedatives. The heart rate, respiratory rate, oxygen                         saturations, blood pressure, adequacy of pulmonary                         ventilation, and response to care were monitored                         throughout the procedure. The physical status of the                         patient was re-assessed after the procedure.                        After obtaining informed consent, the colonoscope was                         passed under direct vision. Throughout the procedure,                         the patient's blood pressure, pulse, and oxygen                         saturations were monitored continuously. The  Colonoscope was introduced through the anus and                         advanced to the the cecum, identified by appendiceal                         orifice and ileocecal valve. The colonoscopy was                         performed with moderate difficulty due to a tortuous                         colon. Successful completion of the procedure was                         aided by applying abdominal pressure. The patient                         tolerated the procedure well. The quality of the bowel                         preparation was evaluated using the BBPS [facility] Bowel                          Preparation Scale) with scores of: Right Colon = 3,                         Transverse Colon = 3 and Left Colon = 3 (entire mucosa                         seen well with no residual staining, small fragments                         of stool or opaque liquid). The total BBPS score                         equals 9. Findings:      The perianal and digital rectal examinations were normal. Pertinent       negatives include normal sphincter tone and no palpable rectal lesions.      The entire examined colon appeared normal.      Non-bleeding external and internal hemorrhoids were found during       retroflexion. The hemorrhoids were medium-sized. Impression:            - The entire examined colon is normal.                        - Non-bleeding external and internal hemorrhoids.                        - No specimens collected. Recommendation:        - Discharge patient to home (with escort).                        - Resume previous diet today.                        - Continue present medications.                        -  Repeat colonoscopy in 10 years for screening                         purposes. Procedure Code(s):     --- Professional ---                        S1282, Colorectal cancer screening; colonoscopy on                         individual at high risk Diagnosis Code(s):     --- Professional ---                        Z86.010, Personal history of colonic polyps                        K64.8, Other hemorrhoids CPT copyright 2019 American Medical Association. All rights reserved. The codes documented in this report are preliminary and upon coder review may  be revised to meet current compliance requirements. Dr. Ulyess Mort Lin Landsman MD, MD 01/15/2022 4:38:51 PM This report has been signed electronically. Number of Addenda: 0 Note Initiated On: 01/15/2022 2:00 PM Scope Withdrawal Time: 0 hours 8 minutes 30 seconds  Total Procedure Duration: 0 hours 18 minutes 19  seconds  Estimated Blood Loss:  Estimated blood loss: none.      [facility]

## 2022-01-15 NOTE — H&P (Signed)
Cephas Darby, MD [address]  [place]  [place], [place] [address]  Main: [phone]  Fax: [phone] Pager: [phone]  Primary Care Physician:  Lesleigh Noe, MD Primary Gastroenterologist:  Dr. Cephas Darby  Pre-Procedure History & Physical: HPI:  Tina Allen is a 56 y.o. female is here for an colonoscopy.   Past Medical History:  Diagnosis Date   COVID-19 08/2020   Dysplastic nevus 09/03/2019   Left hip/flank above waistline. Moderate atypia, limited margins free.    HSV-2 (herpes simplex virus 2) infection 2006   Vitamin D deficiency     Past Surgical History:  Procedure Laterality Date   CERVICAL BIOPSY  W/ LOOP ELECTRODE EXCISION  1996   DYSPLASIA    Prior to Admission medications   Medication Sig Start Date End Date Taking? Authorizing Provider  ALPRAZolam Duanne Moron) 0.25 MG tablet TAKE 1 TABLET BY MOUTH AT BEDTIME AS NEEDED FOR ANXIETY 10/27/21  Yes Lesleigh Noe, MD  estradiol (ESTRACE) 0.5 MG tablet Take 1 tablet (0.5 mg total) by mouth daily. 10/27/21  Yes Lesleigh Noe, MD  ibuprofen (ADVIL) 200 MG tablet Take 1 tablet (200 mg total) by mouth every 6 (six) hours as needed. 05/03/20  Yes Marny Lowenstein A, NP  medroxyPROGESTERone (PROVERA) 5 MG tablet Take one tablet by mouth daily on days 1-12 of each month. 10/27/21  Yes Lesleigh Noe, MD  Multiple Vitamins-Minerals (WOMENS 50+ [place] VITAMIN/MIN PO) Take by mouth.   Yes [provider]  albuterol (VENTOLIN HFA) 108 (90 Base) MCG/ACT inhaler Inhale into the lungs. 08/15/18   [provider]  LATISSE 0.03 % ophthalmic solution PLACE 1 APPLICATION INTO BOTH EYES AS DIRECTED. PLACE ONE DROP ON APPLICATOR AND APPLY EVENLY ALONG THE SKIN OF THE UPPER EYELID AT BASE OF EYELASHES ONCE DAILY AT BEDTIME REPEAT PROCEDURE FOR SECOND EYE (USE A CLEAN APPLICATOR). 08/03/21   Ralene Bathe, MD    Allergies as of 01/15/2022   (No Known Allergies)    Family History  Problem Relation  Age of Onset   Aneurysm Mother        DESEACED AT 11.Marland Kitchen BRAIN   Hypertension Mother    Stroke Father        DECEASED AT 98 , [place]   Alcohol abuse Father    Hypertension Maternal Aunt    Hypertension Maternal Grandmother    Breast cancer Maternal Grandmother        diagnosed in her 8's   Hypertension Maternal Grandfather    Cancer Maternal Grandfather        unsure, brain?   Alcohol abuse Brother     Social History   Socioeconomic History   Marital status: Divorced    Spouse name: Not on file   Number of children: 1   Years of education: Hotel manager college   Highest education level: Not on file  Occupational History   Not on file  Tobacco Use   Smoking status: Never   Smokeless tobacco: Never  Vaping Use   Vaping Use: Never used  Substance and Sexual Activity   Alcohol use: Yes    Comment: 4 drinks a week   Drug use: No   Sexual activity: Not Currently    Partners: Male    Birth control/protection: Other-see comments, Abstinence    Comment: vasectomy  Other Topics Concern   Not on file  Social History Narrative   09/30/19   From: [place]   Living: with daughter  Work: Cabin crew with [employer]      Family: Daughter - Radonna Ricker (2003) - good relationship       Enjoys: travel, got to the beach/mountains      Exercise: not currently, achy knees   Diet: working on healthy eating now      Land belts: Yes    Guns: No   Safe in relationships: Yes    Social Determinants of Radio broadcast assistant Strain: Not on file  Food Insecurity: Not on file  Transportation Needs: Not on file  Physical Activity: Not on file  Stress: Not on file  Social Connections: Not on file  Intimate Partner Violence: Not on file    Review of Systems: See HPI, otherwise negative ROS  Physical Exam: BP 136/86   Pulse 64   Temp (!) 96.6 F (35.9 C) (Temporal)   Resp 18   Ht '5\' 5"'$  (1.651 m)   Wt 67.1 kg   LMP 12/26/2019   SpO2 100%   BMI 24.63 kg/m  General:   Alert,   pleasant and cooperative in NAD Head:  Normocephalic and atraumatic. Neck:  Supple; no masses or thyromegaly. Lungs:  Clear throughout to auscultation.    Heart:  Regular rate and rhythm. Abdomen:  Soft, nontender and nondistended. Normal bowel sounds, without guarding, and without rebound.   Neurologic:  Alert and  oriented x4;  grossly normal neurologically.  Impression/Plan: Tina Allen is here for an colonoscopy to be performed for tubular adenoma of colon  Risks, benefits, limitations, and alternatives regarding  colonoscopy have been reviewed with the patient.  Questions have been answered.  All parties agreeable.   Sherri Sear, MD  01/15/2022, 4:10 PM

## 2022-01-15 NOTE — Transfer of Care (Signed)
Immediate Anesthesia Transfer of Care Note  Patient: Tina Allen  Procedure(s) Performed: COLONOSCOPY  Patient Location: PACU and Endoscopy Unit  Anesthesia Type:General  Level of Consciousness: drowsy and patient cooperative  Airway & Oxygen Therapy: Patient Spontanous Breathing  Post-op Assessment: Report given to RN and Post -op Vital signs reviewed and stable  Post vital signs: Reviewed and stable  Last Vitals:  Vitals Value Taken Time  BP 90/62 01/15/22 1641  Temp 36.2 C 01/15/22 1641  Pulse 64 01/15/22 1644  Resp 18 01/15/22 1644  SpO2 99 % 01/15/22 1644  Vitals shown include unvalidated device data.  Last Pain:  Vitals:   01/15/22 1641  TempSrc: Temporal  PainSc: Asleep         Complications: No notable events documented.

## 2022-01-16 ENCOUNTER — Encounter: Payer: Self-pay | Admitting: Gastroenterology

## 2022-01-16 NOTE — Anesthesia Postprocedure Evaluation (Signed)
Anesthesia Post Note  Patient: Tina Allen  Procedure(s) Performed: COLONOSCOPY  Patient location during evaluation: Endoscopy Anesthesia Type: General Level of consciousness: awake and alert Pain management: pain level controlled Vital Signs Assessment: post-procedure vital signs reviewed and stable Respiratory status: spontaneous breathing, nonlabored ventilation, respiratory function stable and patient connected to nasal cannula oxygen Cardiovascular status: blood pressure returned to baseline and stable Postop Assessment: no apparent nausea or vomiting Anesthetic complications: no   No notable events documented.   Last Vitals:  Vitals:   01/15/22 1651 01/15/22 1701  BP: 96/61 93/66  Pulse: 60 (!) 55  Resp: 16 20  Temp:    SpO2: 99% 100%    Last Pain:  Vitals:   01/15/22 1701  TempSrc:   PainSc: 0-No pain                 Martha Clan

## 2022-03-20 ENCOUNTER — Ambulatory Visit (INDEPENDENT_AMBULATORY_CARE_PROVIDER_SITE_OTHER): Payer: Self-pay | Admitting: Dermatology

## 2022-03-20 DIAGNOSIS — L988 Other specified disorders of the skin and subcutaneous tissue: Secondary | ICD-10-CM

## 2022-03-20 MED ORDER — TRETINOIN 0.025 % EX GEL: Freq: Every day | CUTANEOUS | 6 refills | Status: AC

## 2022-03-20 NOTE — Patient Instructions (Signed)
Due to recent changes in healthcare laws, you may see results of your pathology and/or laboratory studies on MyChart before the doctors have had a chance to review them. We understand that in some cases there may be results that are confusing or concerning to you. Please understand that not all results are received at the same time and often the doctors may need to interpret multiple results in order to provide you with the best plan of care or course of treatment. Therefore, we ask that you please give us 2 business days to thoroughly review all your results before contacting the office for clarification. Should we see a critical lab result, you will be contacted sooner.   If You Need Anything After Your Visit  If you have any questions or concerns for your doctor, please call our main line at [phone] and press option 4 to reach your doctor's medical assistant. If no one answers, please leave a voicemail as directed and we will return your call as soon as possible. Messages left after 4 pm will be answered the following business day.   You may also send us a message via MyChart. We typically respond to MyChart messages within 1-2 business days.  For prescription refills, please ask your pharmacy to contact our office. Our fax number is [phone].  If you have an urgent issue when the clinic is closed that cannot wait until the next business day, you can page your doctor at the number below.    Please note that while we do our best to be available for urgent issues outside of office hours, we are not available 24/7.   If you have an urgent issue and are unable to reach us, you may choose to seek medical care at your doctor's office, retail clinic, urgent care center, or emergency room.  If you have a medical emergency, please immediately call 911 or go to the emergency department.  Pager Numbers  - Dr. Kowalski: [phone]  - Dr. Moye: [phone]  - Dr. Stewart:  [phone]  In the event of inclement weather, please call our main line at [phone] for an update on the status of any delays or closures.  Dermatology Medication Tips: Please keep the boxes that topical medications come in in order to help keep track of the instructions about where and how to use these. Pharmacies typically print the medication instructions only on the boxes and not directly on the medication tubes.   If your medication is too expensive, please contact our office at [phone] option 4 or send us a message through MyChart.   We are unable to tell what your co-pay for medications will be in advance as this is different depending on your insurance coverage. However, we may be able to find a substitute medication at lower cost or fill out paperwork to get insurance to cover a needed medication.   If a prior authorization is required to get your medication covered by your insurance company, please allow us 1-2 business days to complete this process.  Drug prices often vary depending on where the prescription is filled and some pharmacies may offer cheaper prices.  The website www.goodrx.com contains coupons for medications through different pharmacies. The prices here do not account for what the cost may be with help from insurance (it may be cheaper with your insurance), but the website can give you the price if you did not use any insurance.  - You can print the associated coupon and take it with   your prescription to the pharmacy.  - You may also stop by our office during regular business hours and pick up a GoodRx coupon card.  - If you need your prescription sent electronically to a different pharmacy, notify our office through [facility] MyChart or by phone at [phone] option 4.     Si Usted Necesita Algo Despus de Su Visita  Tambin puede enviarnos un mensaje a travs de MyChart. Por lo general respondemos a los mensajes de MyChart en el transcurso de 1 a 2  das hbiles.  Para renovar recetas, por favor pida a su farmacia que se ponga en contacto con nuestra oficina. Nuestro nmero de fax es el [phone].  Si tiene un asunto urgente cuando la clnica est cerrada y que no puede esperar hasta el siguiente da hbil, puede llamar/localizar a su doctor(a) al nmero que aparece a continuacin.   Por favor, tenga en cuenta que aunque hacemos todo lo posible para estar disponibles para asuntos urgentes fuera del horario de oficina, no estamos disponibles las 24 horas del da, los 7 das de la semana.   Si tiene un problema urgente y no puede comunicarse con nosotros, puede optar por buscar atencin mdica  en el consultorio de su doctor(a), en una clnica privada, en un centro de atencin urgente o en una sala de emergencias.  Si tiene una emergencia mdica, por favor llame inmediatamente al 911 o vaya a la sala de emergencias.  Nmeros de bper  - Dr. Kowalski: [phone]  - Dra. Moye: [phone]  - Dra. Stewart: [phone]  En caso de inclemencias del tiempo, por favor llame a nuestra lnea principal al [phone] para una actualizacin sobre el estado de cualquier retraso o cierre.  Consejos para la medicacin en dermatologa: Por favor, guarde las cajas en las que vienen los medicamentos de uso tpico para ayudarle a seguir las instrucciones sobre dnde y cmo usarlos. Las farmacias generalmente imprimen las instrucciones del medicamento slo en las cajas y no directamente en los tubos del medicamento.   Si su medicamento es muy caro, por favor, pngase en contacto con nuestra oficina llamando al [phone] y presione la opcin 4 o envenos un mensaje a travs de MyChart.   No podemos decirle cul ser su copago por los medicamentos por adelantado ya que esto es diferente dependiendo de la cobertura de su seguro. Sin embargo, es posible que podamos encontrar un medicamento sustituto a menor costo o llenar un formulario para que el  seguro cubra el medicamento que se considera necesario.   Si se requiere una autorizacin previa para que su compaa de seguros cubra su medicamento, por favor permtanos de 1 a 2 das hbiles para completar este proceso.  Los precios de los medicamentos varan con frecuencia dependiendo del lugar de dnde se surte la receta y alguna farmacias pueden ofrecer precios ms baratos.  El sitio web www.goodrx.com tiene cupones para medicamentos de diferentes farmacias. Los precios aqu no tienen en cuenta lo que podra costar con la ayuda del seguro (puede ser ms barato con su seguro), pero el sitio web puede darle el precio si no utiliz ningn seguro.  - Puede imprimir el cupn correspondiente y llevarlo con su receta a la farmacia.  - Tambin puede pasar por nuestra oficina durante el horario de atencin regular y recoger una tarjeta de cupones de GoodRx.  - Si necesita que su receta se enve electrnicamente a una farmacia diferente, informe a nuestra oficina a travs de MyChart de    o por telfono llamando al [phone] y presione la opcin 4.  

## 2022-03-20 NOTE — Progress Notes (Signed)
   Follow-Up Visit   Subjective  Tina Allen is a 56 y.o. female who presents for the following: Facial Elastosis (Botox today).  The following portions of the chart were reviewed this encounter and updated as appropriate:   Tobacco  Allergies  Meds  Problems  Med Hx  Surg Hx  Fam Hx     Review of Systems:  No other skin or systemic complaints except as noted in HPI or Assessment and Plan.  Objective  Well appearing patient in no apparent distress; mood and affect are within normal limits.  A focused examination was performed including face. Relevant physical exam findings are noted in the Assessment and Plan.  Face Rhytides and volume loss.       Assessment & Plan  Elastosis of skin Face  Botox today - 45 units  Frown Complex - 20 units Crows Feet - 10 units each side Forehead - 5 units  Start Tretinoin 0.025% qhs  Botox Injection - Face Location: See attached image  Informed consent: Discussed risks (infection, pain, bleeding, bruising, swelling, allergic reaction, paralysis of nearby muscles, eyelid droop, double vision, neck weakness, difficulty breathing, headache, undesirable cosmetic result, and need for additional treatment) and benefits of the procedure, as well as the alternatives.  Informed consent was obtained.  Preparation: The area was cleansed with alcohol.  Procedure Details:  Botox was injected into the dermis with a 30-gauge needle. Pressure applied to any bleeding. Ice packs offered for swelling.  Lot Number:  [id number] Expiration:  04/2024  Total Units Injected:  45  Plan: Patient was instructed to remain upright for 4 hours. Patient was instructed to avoid massaging the face and avoid vigorous exercise for the rest of the day. Tylenol may be used for headache.  Allow 2 weeks before returning to clinic for additional dosing as needed. Patient will call for any problems.   tretinoin (RETIN-A) 0.025 % gel - Face Apply topically at  bedtime.   Return for Botox in 3-4 months.  I, Ashok Cordia, CMA, am acting as scribe for Sarina Ser, MD . Documentation: I have reviewed the above documentation for accuracy and completeness, and I agree with the above.  Sarina Ser, MD

## 2022-03-25 ENCOUNTER — Encounter: Payer: Self-pay | Admitting: Dermatology

## 2022-08-03 IMAGING — MG MM DIGITAL SCREENING BILAT W/ TOMO AND CAD
8 series · 8 of 24 positions shown · non-contrast
Comparison: Previous exam(s).

CLINICAL DATA: Screening.

EXAM:
DIGITAL SCREENING BILATERAL MAMMOGRAM WITH TOMOSYNTHESIS AND CAD
TECHNIQUE: Bilateral screening digital craniocaudal and mediolateral oblique
mammograms were obtained. Bilateral screening digital breast
tomosynthesis was performed. The images were evaluated with
computer-aided detection.

[L MLO synth-2D]
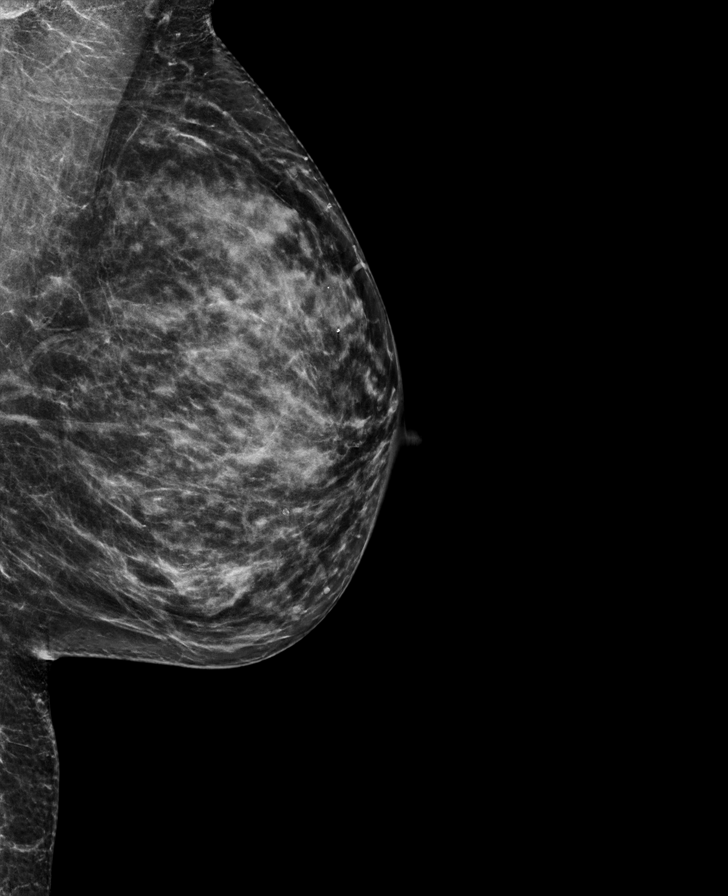

[R CC synth-2D]
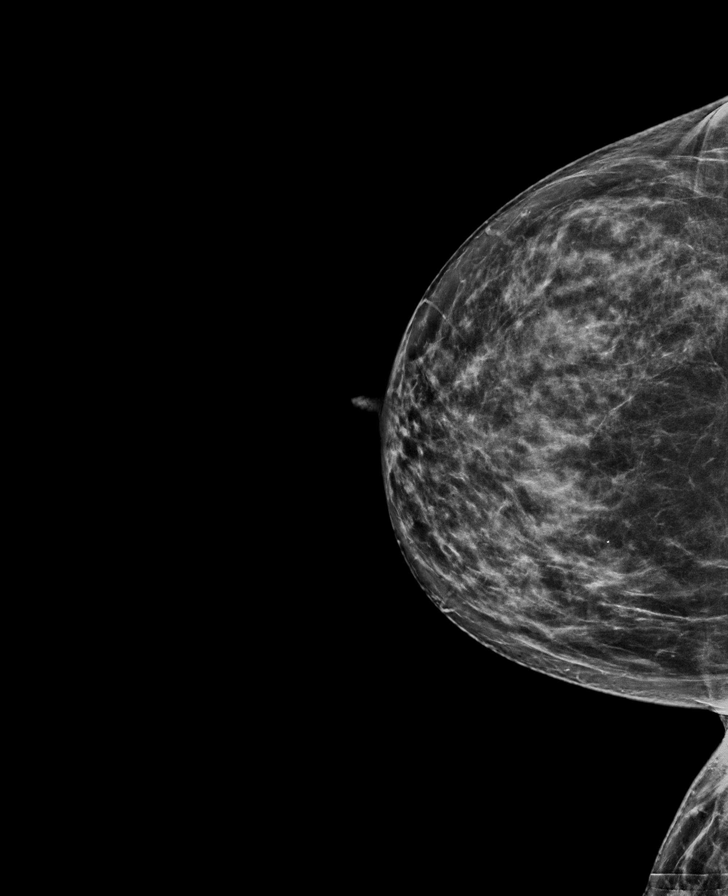

[R MLO synth-2D]
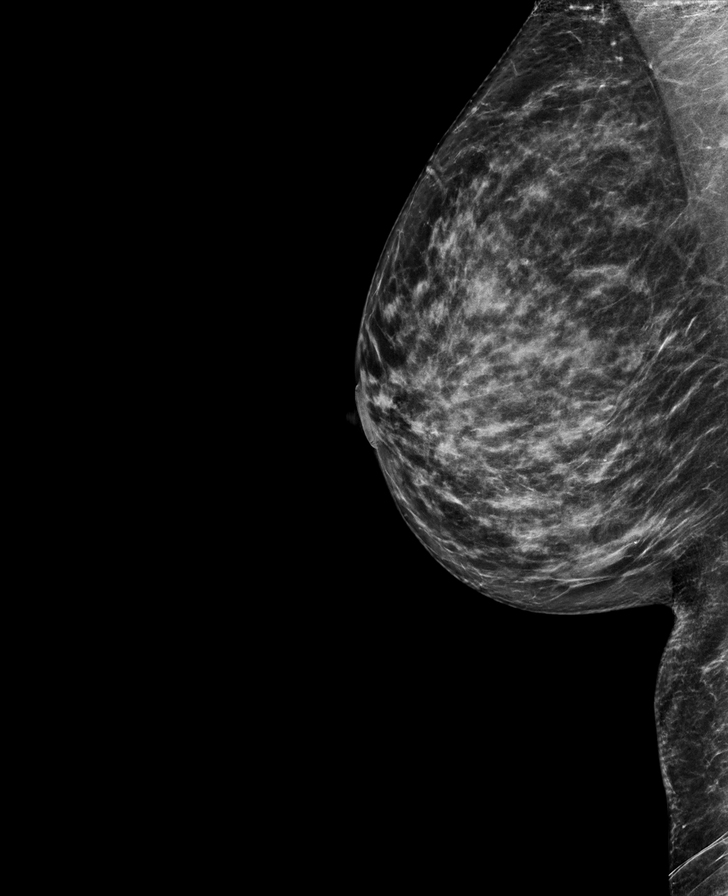

[L CC synth-2D]
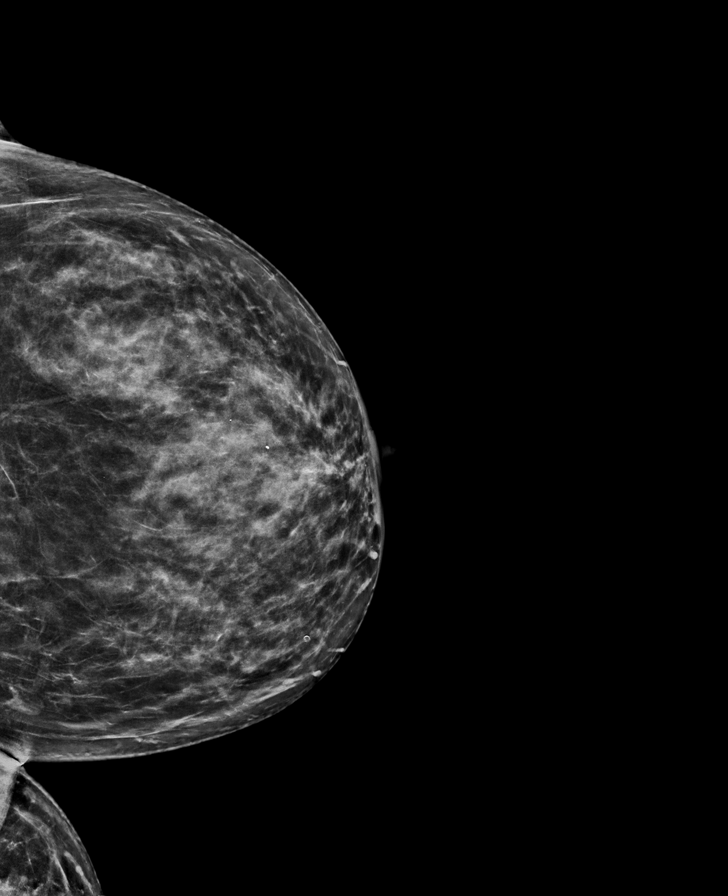

[R CC tomo · tomo slice 39/76.0]
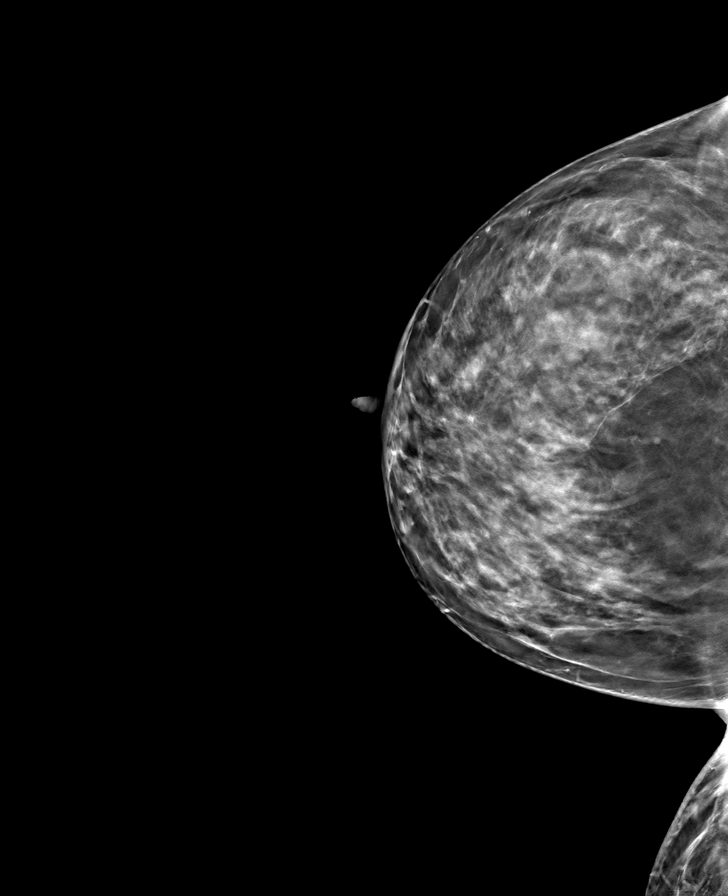

[L MLO tomo · tomo slice 35/69.0]
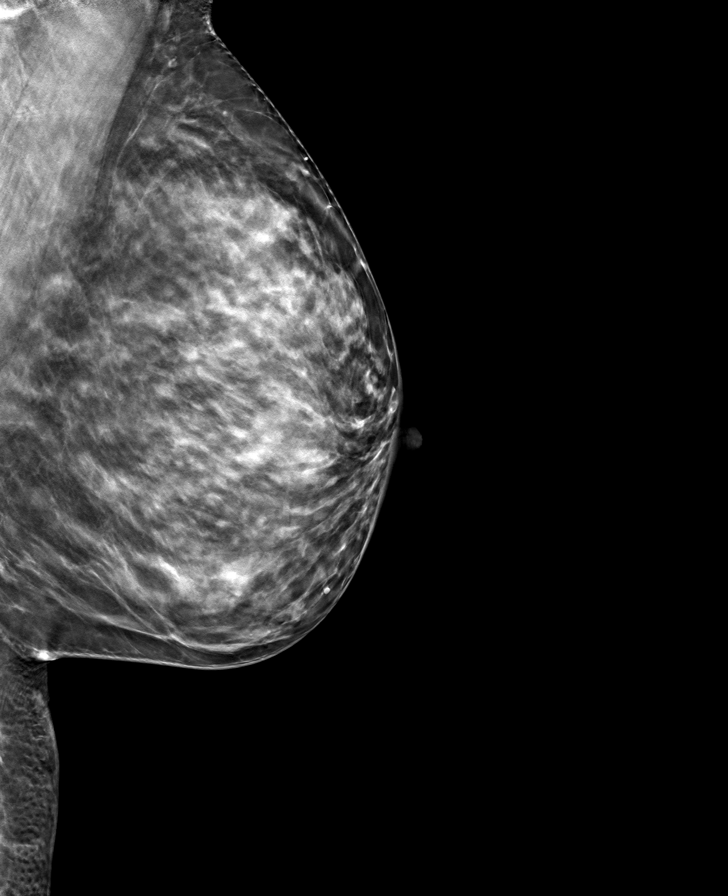

[L CC tomo · tomo slice 37/72.0]
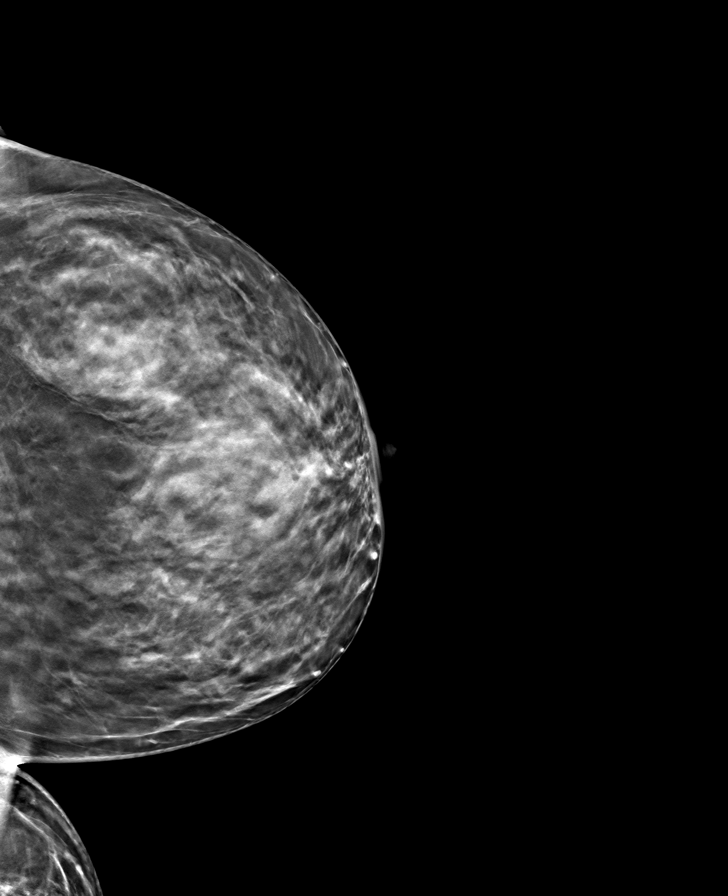

[R MLO tomo · tomo slice 36/71.0]
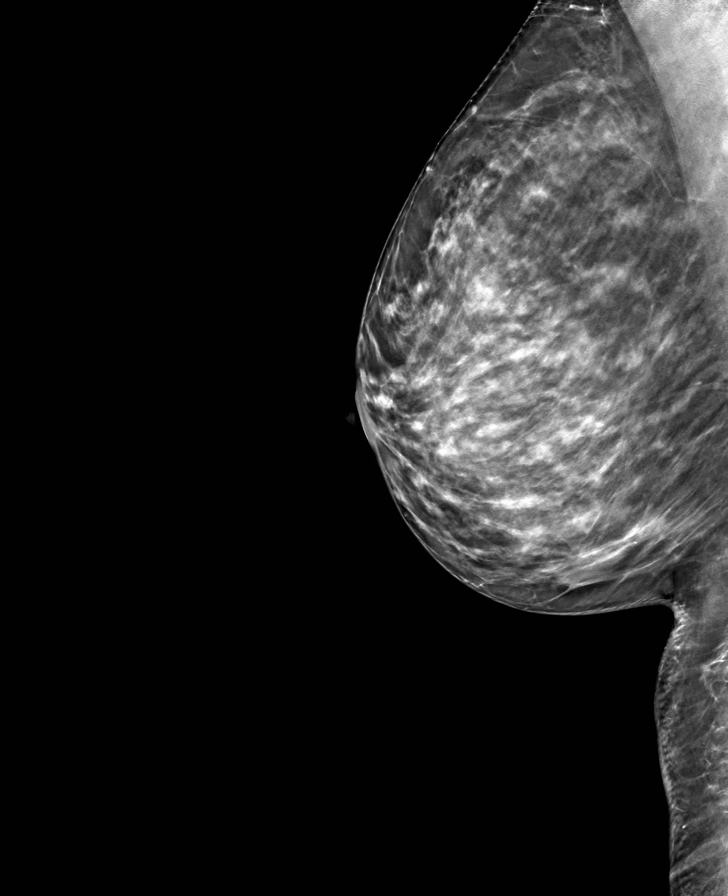

[8 of 24 positions shown; findings below may reference images not displayed]

ACR Breast Density Category c: The breast tissue is heterogeneously
dense, which may obscure small masses.
FINDINGS: There are no findings suspicious for malignancy.
IMPRESSION: No mammographic evidence of malignancy. A result letter of this
screening mammogram will be mailed directly to the patient.

RECOMMENDATION:
Screening mammogram in one year. (Code:Q3-W-BC3)

BI-RADS CATEGORY  1: Negative.

## 2022-08-07 ENCOUNTER — Telehealth: Payer: Self-pay

## 2022-08-07 NOTE — Telephone Encounter (Signed)
Left pt a message that we had a cancelation for tomorrow 08/08/22 at 2:00pm if she would like to move her 08/15/22 appt up a week.Tina Allen

## 2022-08-09 ENCOUNTER — Ambulatory Visit (INDEPENDENT_AMBULATORY_CARE_PROVIDER_SITE_OTHER): Payer: BC Managed Care – PPO | Admitting: Obstetrics & Gynecology

## 2022-08-09 ENCOUNTER — Encounter: Payer: Self-pay | Admitting: Obstetrics & Gynecology

## 2022-08-09 VITALS — BP 106/78 | HR 72

## 2022-08-09 DIAGNOSIS — N898 Other specified noninflammatory disorders of vagina: Secondary | ICD-10-CM

## 2022-08-09 DIAGNOSIS — Z79890 Hormone replacement therapy (postmenopausal): Secondary | ICD-10-CM

## 2022-08-09 DIAGNOSIS — B3731 Acute candidiasis of vulva and vagina: Secondary | ICD-10-CM

## 2022-08-09 LAB — WET PREP FOR TRICH, YEAST, CLUE

## 2022-08-09 MED ORDER — FLUCONAZOLE 150 MG PO TABS: 150.0000 mg | ORAL_TABLET | ORAL | 0 refills | Status: AC

## 2022-08-09 MED ORDER — PROGESTERONE MICRONIZED 100 MG PO CAPS: 100.0000 mg | ORAL_CAPSULE | Freq: Every day | ORAL | 4 refills | Status: DC

## 2022-08-09 NOTE — Progress Notes (Signed)
    Tina Allen Oct 11, 1965 [id number]        56 y.o.  G2P1A1L1   RP: Vaginal discharge with itching and irritation   HPI: Vaginal discharge with itching and irritation x a few weeks.  Tried Monistat cream OTC without resolution of symptoms.  Postmenopausal on Estradiol 0.5 mg tab daily.  Stopped cyclic Provera 6-8 months ago because didn't want to have a period, didn't know about the need for Progestin protection of the endometrium.  No PMB.  No pelvic pain.  Abstinent.  C/O hip pains and sciatic pain, will make an appointment to reevaluate with her Ortho.   OB History  Gravida Para Term Preterm AB Living  '2 1 1   1 1  '$ SAB IAB Ectopic Multiple Live Births  1       1    # Outcome Date GA Lbr Len/2nd Weight Sex Delivery Anes PTL Lv  2 SAB           1 Term     F Vag-Spont  N LIV    Past medical history,surgical history, problem list, medications, allergies, family history and social history were all reviewed and documented in the EPIC chart.   Directed ROS with pertinent positives and negatives documented in the history of present illness/assessment and plan.  Exam:  Vitals:   08/09/22 0823  BP: 106/78  Pulse: 72  SpO2: 99%   General appearance:  Normal  Abdomen: Normal  Gynecologic exam: Vulva normal.  Speculum:  Cervix/Vagina normal.  Thick discharge/cream.  Wet prep done.  Wet prep:  Yeasts present   Assessment/Plan:  56 y.o. G2P1011   1. Vaginal itching Vaginal discharge with itching and irritation x a few weeks.  Tried Monistat cream OTC without resolution of symptoms.  No pelvic pain.  Abstinent.  Yeast vaginitis confirmed by wet prep.  Will treat with Fluconazole.  Prescription sent to pharmacy. - WET PREP FOR Tina Allen, YEAST, CLUE  2. Postmenopausal hormone replacement therapy Postmenopausal on Estradiol 0.5 mg tab daily.  Stopped cyclic Provera 6-8 months ago because didn't want to have a period, didn't know about the need for Progestin protection of the  endometrium.  No PMB.  Counseling done on HRT.  Will continue Estradiol 0.5 mg daily and add Progesterone 100 mg HS.  Will let a withdrawal bleed come after a week on it and then use continuously.  Prescription sent to pharmacy.  Other orders - progesterone (PROMETRIUM) 100 MG capsule; Take 1 capsule (100 mg total) by mouth at bedtime. - fluconazole (DIFLUCAN) 150 MG tablet; Take 1 tablet (150 mg total) by mouth every other day for 3 doses.   Tina Bruins MD, 8:31 AM 08/09/2022

## 2022-08-10 DIAGNOSIS — S338XXA Sprain of other parts of lumbar spine and pelvis, initial encounter: Secondary | ICD-10-CM | POA: Diagnosis not present

## 2022-08-10 DIAGNOSIS — M25552 Pain in left hip: Secondary | ICD-10-CM | POA: Diagnosis not present

## 2022-08-15 ENCOUNTER — Ambulatory Visit (INDEPENDENT_AMBULATORY_CARE_PROVIDER_SITE_OTHER): Payer: 59 | Admitting: Dermatology

## 2022-08-15 DIAGNOSIS — L988 Other specified disorders of the skin and subcutaneous tissue: Secondary | ICD-10-CM

## 2022-08-15 NOTE — Patient Instructions (Signed)
Due to recent changes in healthcare laws, you may see results of your pathology and/or laboratory studies on MyChart before the doctors have had a chance to review them. We understand that in some cases there may be results that are confusing or concerning to you. Please understand that not all results are received at the same time and often the doctors may need to interpret multiple results in order to provide you with the best plan of care or course of treatment. Therefore, we ask that you please give us 2 business days to thoroughly review all your results before contacting the office for clarification. Should we see a critical lab result, you will be contacted sooner.   If You Need Anything After Your Visit  If you have any questions or concerns for your doctor, please call our main line at [phone] and press option 4 to reach your doctor's medical assistant. If no one answers, please leave a voicemail as directed and we will return your call as soon as possible. Messages left after 4 pm will be answered the following business day.   You may also send us a message via MyChart. We typically respond to MyChart messages within 1-2 business days.  For prescription refills, please ask your pharmacy to contact our office. Our fax number is [phone].  If you have an urgent issue when the clinic is closed that cannot wait until the next business day, you can page your doctor at the number below.    Please note that while we do our best to be available for urgent issues outside of office hours, we are not available 24/7.   If you have an urgent issue and are unable to reach us, you may choose to seek medical care at your doctor's office, retail clinic, urgent care center, or emergency room.  If you have a medical emergency, please immediately call 911 or go to the emergency department.  Pager Numbers  - Dr. Kowalski: [phone]  - Dr. Moye: [phone]  - Dr. Stewart:  [phone]  In the event of inclement weather, please call our main line at [phone] for an update on the status of any delays or closures.  Dermatology Medication Tips: Please keep the boxes that topical medications come in in order to help keep track of the instructions about where and how to use these. Pharmacies typically print the medication instructions only on the boxes and not directly on the medication tubes.   If your medication is too expensive, please contact our office at [phone] option 4 or send us a message through MyChart.   We are unable to tell what your co-pay for medications will be in advance as this is different depending on your insurance coverage. However, we may be able to find a substitute medication at lower cost or fill out paperwork to get insurance to cover a needed medication.   If a prior authorization is required to get your medication covered by your insurance company, please allow us 1-2 business days to complete this process.  Drug prices often vary depending on where the prescription is filled and some pharmacies may offer cheaper prices.  The website www.goodrx.com contains coupons for medications through different pharmacies. The prices here do not account for what the cost may be with help from insurance (it may be cheaper with your insurance), but the website can give you the price if you did not use any insurance.  - You can print the associated coupon and take it with   your prescription to the pharmacy.  - You may also stop by our office during regular business hours and pick up a GoodRx coupon card.  - If you need your prescription sent electronically to a different pharmacy, notify our office through [facility] MyChart or by phone at [phone] option 4.     Si Usted Necesita Algo Despus de Su Visita  Tambin puede enviarnos un mensaje a travs de MyChart. Por lo general respondemos a los mensajes de MyChart en el transcurso de 1 a 2  das hbiles.  Para renovar recetas, por favor pida a su farmacia que se ponga en contacto con nuestra oficina. Nuestro nmero de fax es el [phone].  Si tiene un asunto urgente cuando la clnica est cerrada y que no puede esperar hasta el siguiente da hbil, puede llamar/localizar a su doctor(a) al nmero que aparece a continuacin.   Por favor, tenga en cuenta que aunque hacemos todo lo posible para estar disponibles para asuntos urgentes fuera del horario de oficina, no estamos disponibles las 24 horas del da, los 7 das de la semana.   Si tiene un problema urgente y no puede comunicarse con nosotros, puede optar por buscar atencin mdica  en el consultorio de su doctor(a), en una clnica privada, en un centro de atencin urgente o en una sala de emergencias.  Si tiene una emergencia mdica, por favor llame inmediatamente al 911 o vaya a la sala de emergencias.  Nmeros de bper  - Dr. Kowalski: [phone]  - Dra. Moye: [phone]  - Dra. Stewart: [phone]  En caso de inclemencias del tiempo, por favor llame a nuestra lnea principal al [phone] para una actualizacin sobre el estado de cualquier retraso o cierre.  Consejos para la medicacin en dermatologa: Por favor, guarde las cajas en las que vienen los medicamentos de uso tpico para ayudarle a seguir las instrucciones sobre dnde y cmo usarlos. Las farmacias generalmente imprimen las instrucciones del medicamento slo en las cajas y no directamente en los tubos del medicamento.   Si su medicamento es muy caro, por favor, pngase en contacto con nuestra oficina llamando al [phone] y presione la opcin 4 o envenos un mensaje a travs de MyChart.   No podemos decirle cul ser su copago por los medicamentos por adelantado ya que esto es diferente dependiendo de la cobertura de su seguro. Sin embargo, es posible que podamos encontrar un medicamento sustituto a menor costo o llenar un formulario para que el  seguro cubra el medicamento que se considera necesario.   Si se requiere una autorizacin previa para que su compaa de seguros cubra su medicamento, por favor permtanos de 1 a 2 das hbiles para completar este proceso.  Los precios de los medicamentos varan con frecuencia dependiendo del lugar de dnde se surte la receta y alguna farmacias pueden ofrecer precios ms baratos.  El sitio web www.goodrx.com tiene cupones para medicamentos de diferentes farmacias. Los precios aqu no tienen en cuenta lo que podra costar con la ayuda del seguro (puede ser ms barato con su seguro), pero el sitio web puede darle el precio si no utiliz ningn seguro.  - Puede imprimir el cupn correspondiente y llevarlo con su receta a la farmacia.  - Tambin puede pasar por nuestra oficina durante el horario de atencin regular y recoger una tarjeta de cupones de GoodRx.  - Si necesita que su receta se enve electrnicamente a una farmacia diferente, informe a nuestra oficina a travs de MyChart de [place]   o por telfono llamando al [phone] y presione la opcin 4.  

## 2022-08-15 NOTE — Progress Notes (Signed)
   Follow-Up Visit   Subjective  Tina Allen is a 56 y.o. female who presents for the following: Facial Elastosis (Patient is here today for Botox and fillers).  The following portions of the chart were reviewed this encounter and updated as appropriate:   Tobacco  Allergies  Meds  Problems  Med Hx  Surg Hx  Fam Hx     Review of Systems:  No other skin or systemic complaints except as noted in HPI or Assessment and Plan.  Objective  Well appearing patient in no apparent distress; mood and affect are within normal limits.  A focused examination was performed including the face. Relevant physical exam findings are noted in the Assessment and Plan.  Face Rhytides and volume loss.                           Assessment & Plan  Elastosis of skin Face  Botox 45 units injected as marked:  - Frown complex 20 units - Crow's feet 10 units each for a total of 20 units - Forehead 5 units   Restylane Refyne 85m injected as marked: - B/L oral commissure  - Ant chin  - Scars of the B/L cheek   Filling material injection - Face Prior to the procedure, the patient's past medical history, allergies and the rare but potential risks and complications were reviewed with the patient and a signed consent was obtained. Pre and post-treatment care was discussed and instructions provided.  Risks including vascular occlusion were discussed.   Location: See attached photo  Filler Type: Restylane Refyne  Lot number: [id number] Expiration date: 05/27/2023  Procedure: The area was prepped thoroughly with Puracyn. After introducing the needle into the desired treatment area, the syringe plunger was drawn back to ensure there was no flash of blood prior to injecting the filler in order to minimize risk of intravascular injection and vascular occlusion.    Patient tolerated the procedure well. The patient will call with any problems, questions or concerns prior to their next  appointment.   Botox Injection - Face Location: See attached image  Informed consent: Discussed risks (infection, pain, bleeding, bruising, swelling, allergic reaction, paralysis of nearby muscles, eyelid droop, double vision, neck weakness, difficulty breathing, headache, undesirable cosmetic result, and need for additional treatment) and benefits of the procedure, as well as the alternatives.  Informed consent was obtained.  Preparation: The area was cleansed with alcohol.  Procedure Details:  Botox was injected into the dermis with a 30-gauge needle. Pressure applied to any bleeding. Ice packs offered for swelling.  Lot Number:  [id number]:  02/26  Total Units Injected:  45 units  Plan: Patient was instructed to remain upright for 4 hours. Patient was instructed to avoid massaging the face and avoid vigorous exercise for the rest of the day. Tylenol may be used for headache.  Allow 2 weeks before returning to clinic for additional dosing as needed. Patient will call for any problems.   Related Medications tretinoin (RETIN-A) 0.025 % gel Apply topically at bedtime.   Return in about 3 months (around 11/14/2022) for Botox injections.  ILuther Redo CMA, am acting as scribe for DSarina Ser MD . Documentation: I have reviewed the above documentation for accuracy and completeness, and I agree with the above.  DSarina Ser MD

## 2022-08-17 ENCOUNTER — Telehealth: Payer: Self-pay | Admitting: *Deleted

## 2022-08-17 NOTE — Telephone Encounter (Signed)
Patient called seen Dr.Lavoie on 08/09/22 was prescribed progesterone 100 mg tablet ( was taking cycle provera day 1-12) take estradiol 0.5 mg tablet as well. Reports she started bleeding yesterday only spotting asked if this normal? Please advise

## 2022-08-17 NOTE — Telephone Encounter (Signed)
Spoke with Wende Crease about the below. She asked me to relay to patient to monitor bleeding over weekend and if continues to follow up next week. She may need dose increase.

## 2022-08-17 NOTE — Telephone Encounter (Signed)
Have her start taking '100mg'$  nightly. 12 days is not enough. Needs to take it every day.

## 2022-08-24 ENCOUNTER — Encounter: Payer: Self-pay | Admitting: Dermatology

## 2022-10-26 ENCOUNTER — Other Ambulatory Visit: Payer: Self-pay | Admitting: Family Medicine

## 2022-10-26 DIAGNOSIS — F411 Generalized anxiety disorder: Secondary | ICD-10-CM

## 2022-10-26 MED ORDER — ALPRAZOLAM 0.25 MG PO TABS: ORAL_TABLET | ORAL | 0 refills | Status: AC

## 2022-10-26 MED ORDER — BIMATOPROST 0.03 % EX SOLN: CUTANEOUS | 0 refills | Status: DC

## 2022-10-26 NOTE — Telephone Encounter (Signed)
Prescription Request  10/26/2022   LOV: 10/27/2021  What is the name of the medication or equipment? ALPRAZolam (XANAX) 0.25 MG tablet   Have you contacted your pharmacy to request a refill? Yes   Which pharmacy would you like [facility] [id number] [place] [address] Phone: [phone] Fax: [phone]    Patient notified that their request is being sent to the clinical staff for review and that they should receive a response within 2 business days.   Please advise at Mobile [phone] (mobile)

## 2022-10-26 NOTE — Telephone Encounter (Signed)
Prescription Request  10/26/2022   LOV: 10/27/2021  What is the name of the medication or equipment? LATISSE 0.03 % ophthalmic solution   Have you contacted your pharmacy to request a refill? Yes   Which pharmacy would you like this sent to?    Patient notified that their request is being sent to the clinical staff for review and that they should receive a response within 2 business days.   Please advise at Mobile [phone] (mobile)

## 2022-10-26 NOTE — Telephone Encounter (Signed)
Patient has TOC appointment in April. Ok to fill as pended?

## 2022-10-31 ENCOUNTER — Telehealth: Payer: Self-pay | Admitting: Family Medicine

## 2022-10-31 ENCOUNTER — Other Ambulatory Visit: Payer: Self-pay | Admitting: Dermatology

## 2022-10-31 NOTE — Telephone Encounter (Signed)
Error

## 2022-11-01 ENCOUNTER — Ambulatory Visit (INDEPENDENT_AMBULATORY_CARE_PROVIDER_SITE_OTHER): Payer: Self-pay | Admitting: Dermatology

## 2022-11-01 VITALS — BP 103/67 | HR 67

## 2022-11-01 DIAGNOSIS — L905 Scar conditions and fibrosis of skin: Secondary | ICD-10-CM

## 2022-11-01 DIAGNOSIS — L988 Other specified disorders of the skin and subcutaneous tissue: Secondary | ICD-10-CM

## 2022-11-01 NOTE — Patient Instructions (Signed)
Due to recent changes in healthcare laws, you may see results of your pathology and/or laboratory studies on MyChart before the doctors have had a chance to review them. We understand that in some cases there may be results that are confusing or concerning to you. Please understand that not all results are received at the same time and often the doctors may need to interpret multiple results in order to provide you with the best plan of care or course of treatment. Therefore, we ask that you please give us 2 business days to thoroughly review all your results before contacting the office for clarification. Should we see a critical lab result, you will be contacted sooner.   If You Need Anything After Your Visit  If you have any questions or concerns for your doctor, please call our main line at [phone] and press option 4 to reach your doctor's medical assistant. If no one answers, please leave a voicemail as directed and we will return your call as soon as possible. Messages left after 4 pm will be answered the following business day.   You may also send us a message via MyChart. We typically respond to MyChart messages within 1-2 business days.  For prescription refills, please ask your pharmacy to contact our office. Our fax number is [phone].  If you have an urgent issue when the clinic is closed that cannot wait until the next business day, you can page your doctor at the number below.    Please note that while we do our best to be available for urgent issues outside of office hours, we are not available 24/7.   If you have an urgent issue and are unable to reach us, you may choose to seek medical care at your doctor's office, retail clinic, urgent care center, or emergency room.  If you have a medical emergency, please immediately call 911 or go to the emergency department.  Pager Numbers  - Dr. Kowalski: [phone]  - Dr. Moye: [phone]  - Dr. Stewart:  [phone]  In the event of inclement weather, please call our main line at [phone] for an update on the status of any delays or closures.  Dermatology Medication Tips: Please keep the boxes that topical medications come in in order to help keep track of the instructions about where and how to use these. Pharmacies typically print the medication instructions only on the boxes and not directly on the medication tubes.   If your medication is too expensive, please contact our office at [phone] option 4 or send us a message through MyChart.   We are unable to tell what your co-pay for medications will be in advance as this is different depending on your insurance coverage. However, we may be able to find a substitute medication at lower cost or fill out paperwork to get insurance to cover a needed medication.   If a prior authorization is required to get your medication covered by your insurance company, please allow us 1-2 business days to complete this process.  Drug prices often vary depending on where the prescription is filled and some pharmacies may offer cheaper prices.  The website www.goodrx.com contains coupons for medications through different pharmacies. The prices here do not account for what the cost may be with help from insurance (it may be cheaper with your insurance), but the website can give you the price if you did not use any insurance.  - You can print the associated coupon and take it with   your prescription to the pharmacy.  - You may also stop by our office during regular business hours and pick up a GoodRx coupon card.  - If you need your prescription sent electronically to a different pharmacy, notify our office through [facility] MyChart or by phone at [phone] option 4.     Si Usted Necesita Algo Despus de Su Visita  Tambin puede enviarnos un mensaje a travs de MyChart. Por lo general respondemos a los mensajes de MyChart en el transcurso de 1 a 2  das hbiles.  Para renovar recetas, por favor pida a su farmacia que se ponga en contacto con nuestra oficina. Nuestro nmero de fax es el [phone].  Si tiene un asunto urgente cuando la clnica est cerrada y que no puede esperar hasta el siguiente da hbil, puede llamar/localizar a su doctor(a) al nmero que aparece a continuacin.   Por favor, tenga en cuenta que aunque hacemos todo lo posible para estar disponibles para asuntos urgentes fuera del horario de oficina, no estamos disponibles las 24 horas del da, los 7 das de la semana.   Si tiene un problema urgente y no puede comunicarse con nosotros, puede optar por buscar atencin mdica  en el consultorio de su doctor(a), en una clnica privada, en un centro de atencin urgente o en una sala de emergencias.  Si tiene una emergencia mdica, por favor llame inmediatamente al 911 o vaya a la sala de emergencias.  Nmeros de bper  - Dr. Kowalski: [phone]  - Dra. Moye: [phone]  - Dra. Stewart: [phone]  En caso de inclemencias del tiempo, por favor llame a nuestra lnea principal al [phone] para una actualizacin sobre el estado de cualquier retraso o cierre.  Consejos para la medicacin en dermatologa: Por favor, guarde las cajas en las que vienen los medicamentos de uso tpico para ayudarle a seguir las instrucciones sobre dnde y cmo usarlos. Las farmacias generalmente imprimen las instrucciones del medicamento slo en las cajas y no directamente en los tubos del medicamento.   Si su medicamento es muy caro, por favor, pngase en contacto con nuestra oficina llamando al [phone] y presione la opcin 4 o envenos un mensaje a travs de MyChart.   No podemos decirle cul ser su copago por los medicamentos por adelantado ya que esto es diferente dependiendo de la cobertura de su seguro. Sin embargo, es posible que podamos encontrar un medicamento sustituto a menor costo o llenar un formulario para que el  seguro cubra el medicamento que se considera necesario.   Si se requiere una autorizacin previa para que su compaa de seguros cubra su medicamento, por favor permtanos de 1 a 2 das hbiles para completar este proceso.  Los precios de los medicamentos varan con frecuencia dependiendo del lugar de dnde se surte la receta y alguna farmacias pueden ofrecer precios ms baratos.  El sitio web www.goodrx.com tiene cupones para medicamentos de diferentes farmacias. Los precios aqu no tienen en cuenta lo que podra costar con la ayuda del seguro (puede ser ms barato con su seguro), pero el sitio web puede darle el precio si no utiliz ningn seguro.  - Puede imprimir el cupn correspondiente y llevarlo con su receta a la farmacia.  - Tambin puede pasar por nuestra oficina durante el horario de atencin regular y recoger una tarjeta de cupones de GoodRx.  - Si necesita que su receta se enve electrnicamente a una farmacia diferente, informe a nuestra oficina a travs de MyChart de [facility]   o por telfono llamando al [phone] y presione la opcin 4.  

## 2022-11-01 NOTE — Progress Notes (Signed)
   Follow-Up Visit   Subjective  Tina Allen is a 57 y.o. female who presents for the following: Facial Elastosis (Face, pt presents for botox).  The following portions of the chart were reviewed this encounter and updated as appropriate:   Tobacco  Allergies  Meds  Problems  Med Hx  Surg Hx  Fam Hx     Review of Systems:  No other skin or systemic complaints except as noted in HPI or Assessment and Plan.  Objective  Well appearing patient in no apparent distress; mood and affect are within normal limits.  A focused examination was performed including face. Relevant physical exam findings are noted in the Assessment and Plan.  face Rhytides and volume loss.      face Ice pick scarring bil cheeks   Assessment & Plan  Elastosis of skin face  Botox 45 units injected as marked:  - Frown complex 20 units - Crow's feet 10 units each for a total of 20 units - Forehead 5 units     Botox Injection - face Location: frown complex, forehead, bil crows feet  Informed consent: Discussed risks (infection, pain, bleeding, bruising, swelling, allergic reaction, paralysis of nearby muscles, eyelid droop, double vision, neck weakness, difficulty breathing, headache, undesirable cosmetic result, and need for additional treatment) and benefits of the procedure, as well as the alternatives.  Informed consent was obtained.  Preparation: The area was cleansed with alcohol.  Procedure Details:  Botox was injected into the dermis with a 30-gauge needle. Pressure applied to any bleeding. Ice packs offered for swelling.  Lot Number:  [id number] Expiration:  10/2024  Total Units Injected:  45  Plan: Patient was instructed to remain upright for 4 hours. Patient was instructed to avoid massaging the face and avoid vigorous exercise for the rest of the day. Tylenol may be used for headache.  Allow 2 weeks before returning to clinic for additional dosing as needed. Patient will call for any  problems.   Related Medications tretinoin (RETIN-A) 0.025 % gel Apply topically at bedtime.  Scar face  Discussed TCA peels, Laser treatments Recommend pt have consult with Dr. Laurence Ferrari to discuss treatment   Return for 3-46mBotox.  I, SOthelia Pulling RMA, am acting as scribe for DSarina Ser MD . Documentation: I have reviewed the above documentation for accuracy and completeness, and I agree with the above.  DSarina Ser MD

## 2022-11-04 ENCOUNTER — Encounter: Payer: Self-pay | Admitting: Dermatology

## 2022-11-12 ENCOUNTER — Other Ambulatory Visit (HOSPITAL_BASED_OUTPATIENT_CLINIC_OR_DEPARTMENT_OTHER): Payer: Self-pay | Admitting: Family Medicine

## 2022-11-12 DIAGNOSIS — Z1231 Encounter for screening mammogram for malignant neoplasm of breast: Secondary | ICD-10-CM

## 2022-11-13 ENCOUNTER — Ambulatory Visit: Payer: Self-pay | Admitting: Dermatology

## 2022-11-13 ENCOUNTER — Other Ambulatory Visit: Payer: Self-pay

## 2022-11-13 DIAGNOSIS — N951 Menopausal and female climacteric states: Secondary | ICD-10-CM

## 2022-11-13 MED ORDER — ESTRADIOL 0.5 MG PO TABS: 0.5000 mg | ORAL_TABLET | Freq: Every day | ORAL | 0 refills | Status: DC

## 2022-11-13 NOTE — Telephone Encounter (Signed)
Prescription Request  11/13/2022  LOV: 10/27/21 acute visit and 05/30/2021 annual exam  Pt has TOC appt already scheduled with Eugenia Pancoast FNP on 12/14/2022.  What is the name of the medication or equipment? Estraidol 0.5 mg  Last written 11/01/21 # 90 x 3 and last refilled # 90 on 08/13/2022.  Have you contacted your pharmacy to request a refill? Yes   Which pharmacy would you like this sent to?  [facility] [id number], [place] [address] Phone: [phone] Fax: [phone]    Patient notified that their request is being sent to the clinical staff for review and that they should receive a response within 2 business days.   Please advise at  [facility].   Sending note to Dutch Quint FNP

## 2022-12-06 ENCOUNTER — Ambulatory Visit
Admission: EM | Admit: 2022-12-06 | Discharge: 2022-12-06 | Disposition: A | Discharge status: Home / Self Care | Payer: BLUE CROSS/BLUE SHIELD | Attending: Emergency Medicine | Admitting: Emergency Medicine

## 2022-12-06 ENCOUNTER — Encounter: Payer: Self-pay | Admitting: Emergency Medicine

## 2022-12-06 DIAGNOSIS — J0110 Acute frontal sinusitis, unspecified: Secondary | ICD-10-CM

## 2022-12-06 MED ORDER — AMOXICILLIN 875 MG PO TABS: 875.0000 mg | ORAL_TABLET | Freq: Two times a day (BID) | ORAL | 0 refills | Status: AC

## 2022-12-06 NOTE — ED Triage Notes (Signed)
Pt said x 12 days has been having nasal pressure and drainage. Pt also saying her teeth are hurting her. No fevers, no chills

## 2022-12-06 NOTE — Discharge Instructions (Addendum)
Take the amoxicillin as directed.  Follow up with your primary care provider if your symptoms are not improving.   ° ° °

## 2022-12-06 NOTE — ED Provider Notes (Signed)
Renaldo Fiddler    CSN: [id number] Arrival date & time: 12/06/22  1248      History   Chief Complaint Chief Complaint  Patient presents with   Facial Pain   Nasal Congestion    HPI LATRESIA BODMAN is a 57 y.o. female.  Patient presents with a 12-day history of sinus pressure, congestion, postnasal drip, mild cough.  No fever, chills, sore throat, shortness of breath, or other symptoms.  Several OTC cold and sinus medications attempted without relief.  The history is provided by the patient and medical records.    Past Medical History:  Diagnosis Date   COVID-19 08/2020   Dysplastic nevus 09/03/2019   Left hip/flank above waistline. Moderate atypia, limited margins free.    HSV-2 (herpes simplex virus 2) infection 2006   Vitamin D deficiency     Patient Active Problem List   Diagnosis Date Noted   History of colonic polyps    Tubular adenoma of colon 05/30/2021   Other fatigue 11/15/2020   Hot flashes due to menopause 09/30/2019   Acute pain of right shoulder 09/30/2019   Acute right hip pain 09/30/2019   Neck pain on right side 09/30/2019   Hyperlipidemia 09/30/2019   Esophagitis 08/15/2015   Gallstones 06/27/2015   Cervical dysplasia 04/23/2013   Depression with anxiety 04/23/2013   Familial hemochromatosis 09/06/2011    Past Surgical History:  Procedure Laterality Date   CERVICAL BIOPSY  W/ LOOP ELECTRODE EXCISION  1996   DYSPLASIA   COLONOSCOPY N/A 01/15/2022   Procedure: COLONOSCOPY;  Surgeon: Toney Reil, MD;  Location: [facility] ENDOSCOPY;  Service: Gastroenterology;  Laterality: N/A;    OB History     Gravida  2   Para  1   Term  1   Preterm      AB  1   Living  1      SAB  1   IAB      Ectopic      Multiple      Live Births  1            Home Medications    Prior to Admission medications   Medication Sig Start Date End Date Taking? Authorizing Provider  amoxicillin (AMOXIL) 875 MG tablet Take 1 tablet (875 mg  total) by mouth 2 (two) times daily for 10 days. 12/06/22 12/16/22 Yes Mickie Bail, NP  ALPRAZolam Prudy Feeler) 0.25 MG tablet TAKE 1 TABLET BY MOUTH AT BEDTIME AS NEEDED FOR ANXIETY 10/26/22   Worthy Rancher B, FNP  bimatoprost (LATISSE) 0.03 % ophthalmic solution PLACE 1 APPLICATION INTO BOTH EYES AS DIRECTED. PLACE ONE DROP ON APPLICATOR AND APPLY EVENLY ALONG THE SKIN OF THE UPPER EYELID AT BASE OF EYELASHES ONCE DAILY AT BEDTIME REPEAT PROCEDURE FOR SECOND EYE (USE A CLEAN APPLICATOR). 10/26/22   Eulis Foster, FNP  estradiol (ESTRACE) 0.5 MG tablet Take 1 tablet (0.5 mg total) by mouth daily. 11/13/22   Worthy Rancher B, FNP  ibuprofen (ADVIL) 200 MG tablet Take 1 tablet (200 mg total) by mouth every 6 (six) hours as needed. 05/03/20   Olivia Mackie, NP  progesterone (PROMETRIUM) 100 MG capsule Take 1 capsule (100 mg total) by mouth at bedtime. 08/09/22   Genia Del, MD  tretinoin (RETIN-A) 0.025 % gel Apply topically at bedtime. 03/20/22 03/20/23  Deirdre Evener, MD    Family History Family History  Problem Relation Age of Onset   Aneurysm Mother  DESEACED AT 50.Marland Kitchen. BRAIN   Hypertension Mother    Stroke Father        DECEASED [place] 4644 , [place]   Alcohol abuse Father    Hypertension Maternal Aunt    Hypertension Maternal Grandmother    Breast cancer Maternal Grandmother        diagnosed in her 8250's   Hypertension Maternal Grandfather    Cancer Maternal Grandfather        unsure, brain?   Alcohol abuse Brother     Social History Social History   Tobacco Use   Smoking status: Never   Smokeless tobacco: Never  Vaping Use   Vaping Use: Never used  Substance Use Topics   Alcohol use: Yes    Comment: 4 drinks a week   Drug use: No     Allergies   Patient has no known allergies.   Review of Systems Review of Systems  Constitutional:  Negative for chills and fever.  HENT:  Positive for congestion, postnasal drip, rhinorrhea and sinus pressure. Negative for ear pain  and sore throat.   Respiratory:  Positive for cough. Negative for shortness of breath.   Cardiovascular:  Negative for chest pain and palpitations.  Gastrointestinal:  Negative for diarrhea and vomiting.  Skin:  Negative for color change and rash.  All other systems reviewed and are negative.    Physical Exam Triage Vital Signs ED Triage Vitals [12/06/22 1251]  Enc Vitals Group     BP      Pulse      Resp      Temp      Temp src      SpO2      Weight      Height      Head Circumference      Peak Flow      Pain Score 4     Pain Loc      Pain Edu?      Excl. in GC?    No data found.  Updated Vital Signs BP 137/86 (BP Location: Left Arm)   Pulse 63   Temp 97.9 F (36.6 C) (Oral)   Resp 16   LMP 12/26/2019   SpO2 96%   Visual Acuity Right Eye Distance:   Left Eye Distance:   Bilateral Distance:    Right Eye Near:   Left Eye Near:    Bilateral Near:     Physical Exam Vitals and nursing note reviewed.  Constitutional:      General: She is not in acute distress.    Appearance: Normal appearance. She is well-developed. She is not ill-appearing.  HENT:     Head: Normocephalic and atraumatic.     Right Ear: Tympanic membrane normal.     Left Ear: Tympanic membrane normal.     Nose: Congestion and rhinorrhea present.     Mouth/Throat:     Mouth: Mucous membranes are moist.     Pharynx: Oropharynx is clear.  Cardiovascular:     Rate and Rhythm: Normal rate and regular rhythm.     Heart sounds: Normal heart sounds.  Pulmonary:     Effort: Pulmonary effort is normal. No respiratory distress.     Breath sounds: Normal breath sounds.  Musculoskeletal:     Cervical back: Neck supple.  Skin:    General: Skin is warm and dry.  Neurological:     Mental Status: She is alert.  Psychiatric:        Mood and Affect:  Mood normal.        Behavior: Behavior normal.      UC Treatments / Results  Labs (all labs ordered are listed, but only abnormal results are  displayed) Labs Reviewed - No data to display  EKG   Radiology No results found.  Procedures Procedures (including critical care time)  Medications Ordered in UC Medications - No data to display  Initial Impression / Assessment and Plan / UC Course  I have reviewed the triage vital signs and the nursing notes.  Pertinent labs & imaging results that were available during my care of the patient were reviewed by me and considered in my medical decision making (see chart for details).    Acute sinusitis.  Patient has been symptomatic for 12 days and is not improving with OTC treatment.  Treating today with amoxicillin.  Discussed symptomatic treatment including Tylenol or ibuprofen, Mucinex.  Instructed patient to follow up with her PCP if her symptoms are not improving.  She agrees to plan of care.    Final Clinical Impressions(s) / UC Diagnoses   Final diagnoses:  Acute non-recurrent frontal sinusitis     Discharge Instructions      Take the amoxicillin as directed.  Follow up with your primary care provider if your symptoms are not improving.        ED Prescriptions     Medication Sig Dispense Auth. Provider   amoxicillin (AMOXIL) 875 MG tablet Take 1 tablet (875 mg total) by mouth 2 (two) times daily for 10 days. 20 tablet Mickie Bail, NP      PDMP not reviewed this encounter.   Mickie Bail, NP 12/06/22 1316

## 2022-12-14 ENCOUNTER — Ambulatory Visit (HOSPITAL_BASED_OUTPATIENT_CLINIC_OR_DEPARTMENT_OTHER): Admission: RE | Admit: 2022-12-14 | Payer: BLUE CROSS/BLUE SHIELD | Source: Ambulatory Visit | Admitting: Radiology

## 2022-12-14 ENCOUNTER — Encounter: Payer: BLUE CROSS/BLUE SHIELD | Admitting: Family

## 2022-12-20 ENCOUNTER — Ambulatory Visit: Payer: BLUE CROSS/BLUE SHIELD | Admitting: Dermatology

## 2022-12-25 ENCOUNTER — Ambulatory Visit: Payer: BLUE CROSS/BLUE SHIELD | Admitting: Dermatology

## 2022-12-28 DIAGNOSIS — H00014 Hordeolum externum left upper eyelid: Secondary | ICD-10-CM | POA: Diagnosis not present

## 2023-01-01 ENCOUNTER — Encounter: Payer: Self-pay | Admitting: Family

## 2023-01-01 ENCOUNTER — Ambulatory Visit (INDEPENDENT_AMBULATORY_CARE_PROVIDER_SITE_OTHER): Payer: BC Managed Care – PPO | Admitting: Family

## 2023-01-01 VITALS — BP 126/76 | HR 60 | Temp 97.8°F | Ht 65.0 in | Wt 153.6 lb

## 2023-01-01 DIAGNOSIS — N951 Menopausal and female climacteric states: Secondary | ICD-10-CM

## 2023-01-01 DIAGNOSIS — N819 Female genital prolapse, unspecified: Secondary | ICD-10-CM | POA: Insufficient documentation

## 2023-01-01 DIAGNOSIS — K209 Esophagitis, unspecified without bleeding: Secondary | ICD-10-CM | POA: Diagnosis not present

## 2023-01-01 DIAGNOSIS — Z79890 Hormone replacement therapy (postmenopausal): Secondary | ICD-10-CM | POA: Insufficient documentation

## 2023-01-01 DIAGNOSIS — J309 Allergic rhinitis, unspecified: Secondary | ICD-10-CM | POA: Insufficient documentation

## 2023-01-01 DIAGNOSIS — R21 Rash and other nonspecific skin eruption: Secondary | ICD-10-CM | POA: Insufficient documentation

## 2023-01-01 DIAGNOSIS — E559 Vitamin D deficiency, unspecified: Secondary | ICD-10-CM | POA: Insufficient documentation

## 2023-01-01 DIAGNOSIS — M1990 Unspecified osteoarthritis, unspecified site: Secondary | ICD-10-CM | POA: Insufficient documentation

## 2023-01-01 DIAGNOSIS — M25562 Pain in left knee: Secondary | ICD-10-CM

## 2023-01-01 DIAGNOSIS — G8929 Other chronic pain: Secondary | ICD-10-CM

## 2023-01-01 DIAGNOSIS — J3089 Other allergic rhinitis: Secondary | ICD-10-CM | POA: Diagnosis not present

## 2023-01-01 DIAGNOSIS — M25561 Pain in right knee: Secondary | ICD-10-CM

## 2023-01-01 DIAGNOSIS — K8020 Calculus of gallbladder without cholecystitis without obstruction: Secondary | ICD-10-CM

## 2023-01-01 DIAGNOSIS — N393 Stress incontinence (female) (male): Secondary | ICD-10-CM | POA: Insufficient documentation

## 2023-01-01 DIAGNOSIS — E7800 Pure hypercholesterolemia, unspecified: Secondary | ICD-10-CM

## 2023-01-01 DIAGNOSIS — E782 Mixed hyperlipidemia: Secondary | ICD-10-CM

## 2023-01-01 DIAGNOSIS — E538 Deficiency of other specified B group vitamins: Secondary | ICD-10-CM

## 2023-01-01 MED ORDER — CLOBETASOL PROPIONATE 0.05 % EX CREA: 1.0000 | TOPICAL_CREAM | Freq: Two times a day (BID) | CUTANEOUS | 0 refills | Status: DC

## 2023-01-01 MED ORDER — MELOXICAM 7.5 MG PO TABS: 7.5000 mg | ORAL_TABLET | Freq: Every day | ORAL | 0 refills | Status: DC

## 2023-01-01 NOTE — Assessment & Plan Note (Signed)
Ordered vitamin d pending results.   

## 2023-01-01 NOTE — Assessment & Plan Note (Signed)
Ordered lipid panel, pending results. Work on low cholesterol diet and exercise as tolerated  

## 2023-01-01 NOTE — Assessment & Plan Note (Signed)
Continue f/u with orthopedist as scheduled 

## 2023-01-01 NOTE — Assessment & Plan Note (Signed)
Repeat b12 pending results 

## 2023-01-01 NOTE — Assessment & Plan Note (Signed)
Stop ibuprofen  Start meloxicam

## 2023-01-01 NOTE — Assessment & Plan Note (Signed)
Stable asymptomatic 

## 2023-01-01 NOTE — Assessment & Plan Note (Signed)
Right inner ear  Dermatitis vs psoriasis Rx clobetasol cream

## 2023-01-01 NOTE — Progress Notes (Signed)
Established Patient Office Visit  Subjective:  Patient ID: Tina Allen, female    DOB: 1965/09/26  Age: 57 y.o. MRN: [id number]  CC:  Chief Complaint  Patient presents with   Establish Care    TOC from Dr Selena Batten    HPI Tina Allen is here for a transition of care visit.  Prior provider was: Dr. Gweneth Dimitri   Pt is with acute concerns.   Living in a town home currently where her neighbors smoke in their garage which drifts over to her home. She states they moved in about 12 months ago and she has been experiencing allergy type symptoms since to include runny and burning nose, watery itching eyes, and increased sneezing. Usually notes this is triggered when they are smoking as she can smell it in her home. She has tried to speak with them but they are not willing to compromise. She has tried otc allergy medication without much relief.   Prolapsed genital area per her, she states that she feels bulge coming out of her vagina at times and increased urinary incontinence. Sneezing exacerbates this and or jumping exercises. She does have increased vaginal pressure on and off at times. Always there to a small degree but heightens at other times  chronic concerns:  Anxiety state: alprazolam 0.25 mg once daily prn. She was taking this during bad panic attacks that were worse at night time, panic attacks have subsided, using about twice a month. States pretty controlled.  PMP hot flashes: currently on estrogen however has not yet started on progesterone. Her gyn is going to take over HRT management.   Takes ibuprofen regularly for a right shoulder pain, that has been ongoing for several years.   Right shoulder pain, on and off for years. 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 She has been 'babysitting' it and not really seeing anybody currently, has been going to physical therapy.   Past Medical History:  Diagnosis Date   COVID-19 08/2020    Dysplastic nevus 09/03/2019   Left hip/flank above waistline. Moderate atypia, limited margins free.    HSV-2 (herpes simplex virus 2) infection 2006   Vitamin D deficiency     Past Surgical History:  Procedure Laterality Date   CERVICAL BIOPSY  W/ LOOP ELECTRODE EXCISION  1996   DYSPLASIA   COLONOSCOPY N/A 01/15/2022   Procedure: COLONOSCOPY;  Surgeon: Toney Reil, MD;  Location: [facility] ENDOSCOPY;  Service: Gastroenterology;  Laterality: N/A;    Family History  Problem Relation Age of Onset   Aneurysm Mother        passed age 46   Hypertension Mother    Stroke Father        passed age 65 , smoker   Alcohol abuse Father    Alcohol abuse Brother    Hypertension Maternal Grandmother    Breast cancer Maternal Grandmother        diagnosed in her 40's   Hypertension Maternal Grandfather    Cancer Maternal Grandfather        unsure, brain?   Hypertension Maternal Aunt     Social History   Socioeconomic History   Marital status: Divorced    Spouse name: Not on file   Number of children: 1   Years of education: Scientist, product/process development college   Highest education level: Not on file  Occupational History   Not on file  Tobacco Use   Smoking status: Never   Smokeless  tobacco: Never  Vaping Use   Vaping Use: Never used  Substance and Sexual Activity   Alcohol use: Yes    Comment: 4 drinks a week   Drug use: No   Sexual activity: Not Currently    Partners: Male    Birth control/protection: Abstinence, Post-menopausal  Other Topics Concern   Not on file  Social History Narrative   09/30/19   From: [place]   Living: with daughter   Work: Veterinary surgeon with [employer]      Family: Daughter - Nurse, children's (2003) - good relationship       Enjoys: travel, got to the beach/mountains      Exercise: not currently, achy knees   Diet: working on healthy eating now      IT sales professional belts: Yes    Guns: No   Safe in relationships: Yes    Social Determinants of Corporate investment banker  Strain: Not on file  Food Insecurity: Not on file  Transportation Needs: Not on file  Physical Activity: Not on file  Stress: Not on file  Social Connections: Not on file  Intimate Partner Violence: Not on file    Outpatient Medications Prior to Visit  Medication Sig Dispense Refill   ALPRAZolam (XANAX) 0.25 MG tablet TAKE 1 TABLET BY MOUTH AT BEDTIME AS NEEDED FOR ANXIETY 30 tablet 0   bimatoprost (LATISSE) 0.03 % ophthalmic solution PLACE 1 APPLICATION INTO BOTH EYES AS DIRECTED. PLACE ONE DROP ON APPLICATOR AND APPLY EVENLY ALONG THE SKIN OF THE UPPER EYELID AT BASE OF EYELASHES ONCE DAILY AT BEDTIME REPEAT PROCEDURE FOR SECOND EYE (USE A CLEAN APPLICATOR). 5 mL 0   estradiol (ESTRACE) 0.5 MG tablet Take 1 tablet (0.5 mg total) by mouth daily. 90 tablet 0   progesterone (PROMETRIUM) 100 MG capsule Take 1 capsule (100 mg total) by mouth at bedtime. 90 capsule 4   tretinoin (RETIN-A) 0.025 % gel Apply topically at bedtime. 45 g 6   ibuprofen (ADVIL) 200 MG tablet Take 1 tablet (200 mg total) by mouth every 6 (six) hours as needed. 30 tablet 3   No facility-administered medications prior to visit.    No Known Allergies  ROS: Pertinent symptoms negative unless otherwise noted in HPI      Objective:    Physical Exam Vitals reviewed.  Constitutional:      Appearance: Normal appearance.  Eyes:     General:        Right eye: No discharge.        Left eye: No discharge.     Conjunctiva/sclera: Conjunctivae normal.  Cardiovascular:     Rate and Rhythm: Normal rate and regular rhythm.  Pulmonary:     Effort: Pulmonary effort is normal. No respiratory distress.     Breath sounds: Normal breath sounds.  Musculoskeletal:        General: Normal range of motion.     Cervical back: Normal range of motion.     Comments: Right AC joint tenderness  Pain with apley test  Slight pain with drop can test   Neurological:     General: No focal deficit present.     Mental Status: She is  alert and oriented to person, place, and time. Mental status is at baseline.  Psychiatric:        Mood and Affect: Mood normal.        Behavior: Behavior normal.        Thought Content: Thought content normal.  Judgment: Judgment normal.      BP 126/76   Pulse 60   Temp 97.8 F (36.6 C) (Temporal)   Ht 5\' 5"  (1.651 m)   Wt 153 lb 9.6 oz (69.7 kg)   LMP 12/26/2019   SpO2 98%   BMI 25.56 kg/m  Wt Readings from Last 3 Encounters:  01/01/23 153 lb 9.6 oz (69.7 kg)  01/15/22 148 lb (67.1 kg)  10/27/21 150 lb 4 oz (68.2 kg)     Health Maintenance Due  Topic Date Due   DTaP/Tdap/Td (1 - Tdap) Never done   MAMMOGRAM  12/09/2022    There are no preventive care reminders to display for this patient.  Lab Results  Component Value Date   TSH 3.43 05/30/2021   Lab Results  Component Value Date   WBC 5.0 05/30/2021   HGB 13.5 05/30/2021   HCT 39.6 05/30/2021   MCV 96.8 05/30/2021   PLT 217.0 05/30/2021   Lab Results  Component Value Date   NA 140 05/30/2021   K 4.2 05/30/2021   CO2 28 05/30/2021   GLUCOSE 91 05/30/2021   BUN 12 05/30/2021   CREATININE 0.93 05/30/2021   BILITOT 0.6 05/30/2021   ALKPHOS 64 05/30/2021   AST 17 05/30/2021   ALT 17 05/30/2021   PROT 6.7 05/30/2021   ALBUMIN 4.5 05/30/2021   CALCIUM 9.5 05/30/2021   ANIONGAP 5 06/14/2018   GFR 68.17 05/30/2021   Lab Results  Component Value Date   CHOL 220 (H) 05/30/2021   Lab Results  Component Value Date   HDL 50.80 05/30/2021   Lab Results  Component Value Date   LDLCALC 135 (H) 05/30/2021   Lab Results  Component Value Date   TRIG 168.0 (H) 05/30/2021   Lab Results  Component Value Date   CHOLHDL 4 05/30/2021   No results found for: "HGBA1C"    Assessment & Plan:   Arthritis Assessment & Plan: Stop ibuprofen  Start meloxicam   Orders: -     Meloxicam; Take 1 tablet (7.5 mg total) by mouth daily.  Dispense: 90 tablet; Refill: 0  Postmenopausal HRT (hormone  replacement therapy)  Bilateral chronic knee pain Assessment & Plan: Continue f/u with orthopedist as scheduled.    Familial hemochromatosis (HCC)  Esophagitis Assessment & Plan: Stable asymptomatic   Non-seasonal allergic rhinitis due to other allergic trigger Assessment & Plan: Recommend daily flonase and also zyrtec at night for allergies.     Orders: -     CBC; Future  Vitamin D deficiency Assessment & Plan: Ordered vitamin d pending results.    Orders: -     VITAMIN D 25 Hydroxy (Vit-D Deficiency, Fractures); Future  Elevated LDL cholesterol level -     Lipid panel; Future  Low serum vitamin B12 Assessment & Plan: Repeat b12 pending results.  Orders: -     Vitamin B12; Future -     CBC; Future  Rash Assessment & Plan: Right inner ear  Dermatitis vs psoriasis Rx clobetasol cream   Orders: -     Clobetasol Propionate; Apply 1 Application topically 2 (two) times daily.  Dispense: 30 g; Refill: 0  Female genital prolapse, unspecified type Assessment & Plan: Suspected based off of symptoms deferred pelvic exam today  Referral to urogyn  Also with urinary incontinence, suspect related.   Orders: -     Ambulatory referral to Urogynecology  Urine, incontinence, stress female -     Ambulatory referral to Urogynecology  Gallstones Assessment &  Plan: Stable asymptomatic   Hot flashes due to menopause Assessment & Plan: On HRT therapy  Will continue f/u with this with gyn    Mixed hyperlipidemia Assessment & Plan: Ordered lipid panel, pending results. Work on low cholesterol diet and exercise as tolerated      Meds ordered this encounter  Medications   meloxicam (MOBIC) 7.5 MG tablet    Sig: Take 1 tablet (7.5 mg total) by mouth daily.    Dispense:  90 tablet    Refill:  0   clobetasol cream (TEMOVATE) 0.05 %    Sig: Apply 1 Application topically 2 (two) times daily.    Dispense:  30 g    Refill:  0    Follow-up: Return in about  1 year (around 01/01/2024) for f/u CPE.    Mort Sawyers, FNP

## 2023-01-01 NOTE — Assessment & Plan Note (Signed)
On HRT therapy  Will continue f/u with this with gyn

## 2023-01-01 NOTE — Assessment & Plan Note (Signed)
------------------------------------   Recommend daily flonase and also zyrtec at night for allergies.   ------------------------------------  

## 2023-01-01 NOTE — Patient Instructions (Addendum)
A referral was placed today to urogynecology  Please let us know if you have not heard back within 2 weeks about the referral.  ------------------------------------ Recommend daily flonase and also zyrtec at night for allergies.   Trial meloxicamfor your arthritic type pain.  ------------------------------------  Welcome to our clinic, I am happy to have you as my new patient. I am excited to continue on this healthcare journey with you.  ------------------------------------   I have created an order for lab work today during our visit.  Please schedule an appointment on your way out to return to the lab at your convenience. Please return fasting at your lab appointment (meaning you can only drink black coffee and or water prior to your appointment). I will reach out to you in regards to the labs when I receive the results.    ------------------------------------   Please keep in mind Any my chart messages you send have up to a three business day turnaround for a response.  Phone calls may take up to a one full business day turnaround for a  response.   If you need a medication refill I recommend you request it through the pharmacy as this is easiest for [place] rather than sending a message and or phone call.   Due to recent changes in healthcare laws, you may see results of your imaging and/or laboratory studies on MyChart before I have had a chance to review them.  I understand that in some cases there may be results that are confusing or concerning to you. Please understand that not all results are received at the same time and often I may need to interpret multiple results in order to provide you with the best plan of care or course of treatment. Therefore, I ask that you please give me 2 business days to thoroughly review all your results before contacting my office for clarification. Should we see a critical lab result, you will be contacted sooner.   It was a pleasure seeing you  today! Please do not hesitate to reach out with any questions and or concerns.  Regards,   Mort Sawyers FNP-C

## 2023-01-01 NOTE — Assessment & Plan Note (Signed)
Suspected based off of symptoms deferred pelvic exam today  Referral to urogyn  Also with urinary incontinence, suspect related.

## 2023-01-08 ENCOUNTER — Other Ambulatory Visit: Payer: Self-pay | Admitting: Family

## 2023-01-09 ENCOUNTER — Ambulatory Visit (INDEPENDENT_AMBULATORY_CARE_PROVIDER_SITE_OTHER): Payer: BC Managed Care – PPO | Admitting: Dermatology

## 2023-01-09 VITALS — BP 128/76 | HR 74

## 2023-01-09 DIAGNOSIS — Z7189 Other specified counseling: Secondary | ICD-10-CM | POA: Diagnosis not present

## 2023-01-09 DIAGNOSIS — Z79899 Other long term (current) drug therapy: Secondary | ICD-10-CM

## 2023-01-09 DIAGNOSIS — L235 Allergic contact dermatitis due to other chemical products: Secondary | ICD-10-CM

## 2023-01-09 MED ORDER — MOMETASONE FUROATE 0.1 % EX CREA: TOPICAL_CREAM | CUTANEOUS | 0 refills | Status: DC

## 2023-01-09 NOTE — Progress Notes (Signed)
   Follow-Up Visit   Subjective  Tina Allen is a 57 y.o. female who presents for the following: Patient c/o a rash on her face for 2 weeks, she tried new products from [facility] before this rash started, she stopped all products on her face.   The following portions of the chart were reviewed this encounter and updated as appropriate: medications, allergies, medical history  Review of Systems:  No other skin or systemic complaints except as noted in HPI or Assessment and Plan.  Objective  Well appearing patient in no apparent distress; mood and affect are within normal limits. A focused examination was performed of the following areas:face Relevant exam findings are noted in the Assessment and Plan.           Assessment & Plan   CONTACT DERMATITIS  Pink dry patches on her face  We will try and research Davina Poke roth products to get ingredients, no ingredients on tubes brought in the office today.   D/C otc Davina Poke products roth  Start Mometasone cream apply to affected skin qd-bid prn   We will research Fotona4D  laser treatment and  Platysma Botox   Looked these up and Fotona4D laser does similar things in skin tightening as our SkinTyte on our Sciton laser. Platysma Botox for under jaw line is approximately 2 units per injection site - 3 sites per side = total 12 units.  Return if symptoms worsen or fail to improve.  IAngelique Holm, CMA, am acting as scribe for Armida Sans, MD .   Documentation: I have reviewed the above documentation for accuracy and completeness, and I agree with the above.  Armida Sans, MD

## 2023-01-09 NOTE — Patient Instructions (Signed)
Due to recent changes in healthcare laws, you may see results of your pathology and/or laboratory studies on MyChart before the doctors have had a chance to review them. We understand that in some cases there may be results that are confusing or concerning to you. Please understand that not all results are received at the same time and often the doctors may need to interpret multiple results in order to provide you with the best plan of care or course of treatment. Therefore, we ask that you please give us 2 business days to thoroughly review all your results before contacting the office for clarification. Should we see a critical lab result, you will be contacted sooner.   If You Need Anything After Your Visit  If you have any questions or concerns for your doctor, please call our main line at [phone] and press option 4 to reach your doctor's medical assistant. If no one answers, please leave a voicemail as directed and we will return your call as soon as possible. Messages left after 4 pm will be answered the following business day.   You may also send us a message via MyChart. We typically respond to MyChart messages within 1-2 business days.  For prescription refills, please ask your pharmacy to contact our office. Our fax number is [phone].  If you have an urgent issue when the clinic is closed that cannot wait until the next business day, you can page your doctor at the number below.    Please note that while we do our best to be available for urgent issues outside of office hours, we are not available 24/7.   If you have an urgent issue and are unable to reach us, you may choose to seek medical care at your doctor's office, retail clinic, urgent care center, or emergency room.  If you have a medical emergency, please immediately call 911 or go to the emergency department.  Pager Numbers  - Dr. Kowalski: [phone]  - Dr. Moye: [phone]  - Dr. Stewart:  [phone]  In the event of inclement weather, please call our main line at [phone] for an update on the status of any delays or closures.  Dermatology Medication Tips: Please keep the boxes that topical medications come in in order to help keep track of the instructions about where and how to use these. Pharmacies typically print the medication instructions only on the boxes and not directly on the medication tubes.   If your medication is too expensive, please contact our office at [phone] option 4 or send us a message through MyChart.   We are unable to tell what your co-pay for medications will be in advance as this is different depending on your insurance coverage. However, we may be able to find a substitute medication at lower cost or fill out paperwork to get insurance to cover a needed medication.   If a prior authorization is required to get your medication covered by your insurance company, please allow us 1-2 business days to complete this process.  Drug prices often vary depending on where the prescription is filled and some pharmacies may offer cheaper prices.  The website www.goodrx.com contains coupons for medications through different pharmacies. The prices here do not account for what the cost may be with help from insurance (it may be cheaper with your insurance), but the website can give you the price if you did not use any insurance.  - You can print the associated coupon and take it with   your prescription to the pharmacy.  - You may also stop by our office during regular business hours and pick up a GoodRx coupon card.  - If you need your prescription sent electronically to a different pharmacy, notify our office through [place] [place] MyChart or by phone at [phone] option 4.     Si Usted Necesita Algo Despus de Su Visita  Tambin puede enviarnos un mensaje a travs de MyChart. Por lo general respondemos a los mensajes de MyChart en el transcurso de 1 a 2  das hbiles.  Para renovar recetas, por favor pida a su farmacia que se ponga en contacto con nuestra oficina. Nuestro nmero de fax es el [phone].  Si tiene un asunto urgente cuando la clnica est cerrada y que no puede esperar hasta el siguiente da hbil, puede llamar/localizar a su doctor(a) al nmero que aparece a continuacin.   Por favor, tenga en cuenta que aunque hacemos todo lo posible para estar disponibles para asuntos urgentes fuera del horario de oficina, no estamos disponibles las 24 horas del da, los 7 das de la semana.   Si tiene un problema urgente y no puede comunicarse con nosotros, puede optar por buscar atencin mdica  en el consultorio de su doctor(a), en una clnica privada, en un centro de atencin urgente o en una sala de emergencias.  Si tiene una emergencia mdica, por favor llame inmediatamente al 911 o vaya a la sala de emergencias.  Nmeros de bper  - Dr. Kowalski: [phone]  - Dra. Moye: [phone]  - Dra. Stewart: [phone]  En caso de inclemencias del tiempo, por favor llame a nuestra lnea principal al [phone] para una actualizacin sobre el estado de cualquier retraso o cierre.  Consejos para la medicacin en dermatologa: Por favor, guarde las cajas en las que vienen los medicamentos de uso tpico para ayudarle a seguir las instrucciones sobre dnde y cmo usarlos. Las farmacias generalmente imprimen las instrucciones del medicamento slo en las cajas y no directamente en los tubos del medicamento.   Si su medicamento es muy caro, por favor, pngase en contacto con nuestra oficina llamando al [phone] y presione la opcin 4 o envenos un mensaje a travs de MyChart.   No podemos decirle cul ser su copago por los medicamentos por adelantado ya que esto es diferente dependiendo de la cobertura de su seguro. Sin embargo, es posible que podamos encontrar un medicamento sustituto a menor costo o llenar un formulario para que el  seguro cubra el medicamento que se considera necesario.   Si se requiere una autorizacin previa para que su compaa de seguros cubra su medicamento, por favor permtanos de 1 a 2 das hbiles para completar este proceso.  Los precios de los medicamentos varan con frecuencia dependiendo del lugar de dnde se surte la receta y alguna farmacias pueden ofrecer precios ms baratos.  El sitio web www.goodrx.com tiene cupones para medicamentos de diferentes farmacias. Los precios aqu no tienen en cuenta lo que podra costar con la ayuda del seguro (puede ser ms barato con su seguro), pero el sitio web puede darle el precio si no utiliz ningn seguro.  - Puede imprimir el cupn correspondiente y llevarlo con su receta a la farmacia.  - Tambin puede pasar por nuestra oficina durante el horario de atencin regular y recoger una tarjeta de cupones de GoodRx.  - Si necesita que su receta se enve electrnicamente a una farmacia diferente, informe a nuestra oficina a travs de MyChart de [place]   o por telfono llamando al [phone] y presione la opcin 4.  

## 2023-01-10 ENCOUNTER — Ambulatory Visit (HOSPITAL_BASED_OUTPATIENT_CLINIC_OR_DEPARTMENT_OTHER)
Admission: RE | Admit: 2023-01-10 | Discharge: 2023-01-10 | Disposition: A | Discharge status: Home / Self Care | Payer: BC Managed Care – PPO | Source: Ambulatory Visit | Attending: Family Medicine | Admitting: Family Medicine

## 2023-01-10 ENCOUNTER — Encounter (HOSPITAL_BASED_OUTPATIENT_CLINIC_OR_DEPARTMENT_OTHER): Payer: Self-pay | Admitting: Radiology

## 2023-01-10 ENCOUNTER — Telehealth: Payer: Self-pay

## 2023-01-10 DIAGNOSIS — Z1231 Encounter for screening mammogram for malignant neoplasm of breast: Secondary | ICD-10-CM | POA: Diagnosis not present

## 2023-01-10 NOTE — Telephone Encounter (Signed)
LM on VM I was calling to let pt know her appt scheduled with Dr Neale Burly next week 5/21 will be a consultation only for a chemical peel possibly in the next few weeks before Dr Neale Burly is gone.     Dr Neale Burly is not doing any new laser skin tightening procedures due to that procedure take 3 treatments and Dr Neale Burly will not be here to do 3 treatments

## 2023-01-14 ENCOUNTER — Other Ambulatory Visit: Payer: Self-pay | Admitting: Family Medicine

## 2023-01-14 DIAGNOSIS — R928 Other abnormal and inconclusive findings on diagnostic imaging of breast: Secondary | ICD-10-CM

## 2023-01-15 ENCOUNTER — Ambulatory Visit: Payer: BLUE CROSS/BLUE SHIELD | Admitting: Dermatology

## 2023-01-16 ENCOUNTER — Encounter: Payer: Self-pay | Admitting: Dermatology

## 2023-01-23 ENCOUNTER — Telehealth: Payer: Self-pay | Admitting: Family

## 2023-01-23 NOTE — Telephone Encounter (Signed)
Signed order yesterday 5/28 right before I left. Should be in outbox.

## 2023-01-23 NOTE — Telephone Encounter (Signed)
Provider and CMA was out of office on 5/24. I placed the order is [facility] yesterday 01/22/23. Waiting to be signed.

## 2023-01-23 NOTE — Telephone Encounter (Signed)
[facility] [place] called asking for status of a STAT for faxed over for Tina Allen's signature. [facility] states the pt's appt is scheduled for tomorrow, 5/30. [facility] states if Tina Allen's signature doesn't get received, the appt will have to get cancelled. [facility] states they faxed over forms on 5/24 & again on 5/28. Call back # [phone]

## 2023-01-23 NOTE — Telephone Encounter (Signed)
Order has been faxed and confirmation received with [facility].

## 2023-01-24 ENCOUNTER — Ambulatory Visit
Admission: RE | Admit: 2023-01-24 | Discharge: 2023-01-24 | Disposition: A | Discharge status: Home / Self Care | Payer: BC Managed Care – PPO | Source: Ambulatory Visit | Attending: Family Medicine | Admitting: Family Medicine

## 2023-01-24 ENCOUNTER — Ambulatory Visit: Admission: RE | Admit: 2023-01-24 | Payer: BC Managed Care – PPO | Source: Ambulatory Visit

## 2023-01-24 DIAGNOSIS — R928 Other abnormal and inconclusive findings on diagnostic imaging of breast: Secondary | ICD-10-CM

## 2023-01-24 DIAGNOSIS — R922 Inconclusive mammogram: Secondary | ICD-10-CM | POA: Diagnosis not present

## 2023-03-06 ENCOUNTER — Other Ambulatory Visit: Payer: Self-pay | Admitting: Family

## 2023-03-06 NOTE — Telephone Encounter (Signed)
Prescription Request  03/06/2023  LOV: 01/01/2023  What is the name of the medication or equipment?  estradiol (ESTRACE) 0.5 MG tablet  Have you contacted your pharmacy to request a refill? No   Which pharmacy would you like this sent to?   [id number] [place], [place] - [address] [place] [address] Phone: [phone] Fax: [phone]    Patient notified that their request is being sent to the clinical staff for review and that they should receive a response within 2 business days.   Please advise at Mobile [phone] (mobile)

## 2023-03-06 NOTE — Telephone Encounter (Signed)
LAST APPOINTMENT DATE: 01/01/28   NEXT APPOINTMENT DATE: Visit date not found    LAST REFILL: 11/13/22 by Noah Delaine: #[phone] [id number]

## 2023-03-07 ENCOUNTER — Ambulatory Visit (INDEPENDENT_AMBULATORY_CARE_PROVIDER_SITE_OTHER): Payer: Self-pay | Admitting: Dermatology

## 2023-03-07 VITALS — BP 112/74 | HR 65

## 2023-03-07 DIAGNOSIS — L988 Other specified disorders of the skin and subcutaneous tissue: Secondary | ICD-10-CM

## 2023-03-07 NOTE — Patient Instructions (Signed)
Due to recent changes in healthcare laws, you may see results of your pathology and/or laboratory studies on MyChart before the doctors have had a chance to review them. We understand that in some cases there may be results that are confusing or concerning to you. Please understand that not all results are received at the same time and often the doctors may need to interpret multiple results in order to provide you with the best plan of care or course of treatment. Therefore, we ask that you please give us 2 business days to thoroughly review all your results before contacting the office for clarification. Should we see a critical lab result, you will be contacted sooner.   If You Need Anything After Your Visit  If you have any questions or concerns for your doctor, please call our main line at [phone] and press option 4 to reach your doctor's medical assistant. If no one answers, please leave a voicemail as directed and we will return your call as soon as possible. Messages left after 4 pm will be answered the following business day.   You may also send us a message via MyChart. We typically respond to MyChart messages within 1-2 business days.  For prescription refills, please ask your pharmacy to contact our office. Our fax number is [phone].  If you have an urgent issue when the clinic is closed that cannot wait until the next business day, you can page your doctor at the number below.    Please note that while we do our best to be available for urgent issues outside of office hours, we are not available 24/7.   If you have an urgent issue and are unable to reach us, you may choose to seek medical care at your doctor's office, retail clinic, urgent care center, or emergency room.  If you have a medical emergency, please immediately call 911 or go to the emergency department.  Pager Numbers  - Dr. Kowalski: [phone]  - Dr. Moye: [phone]  - Dr. Stewart:  [phone]  In the event of inclement weather, please call our main line at [phone] for an update on the status of any delays or closures.  Dermatology Medication Tips: Please keep the boxes that topical medications come in in order to help keep track of the instructions about where and how to use these. Pharmacies typically print the medication instructions only on the boxes and not directly on the medication tubes.   If your medication is too expensive, please contact our office at [phone] option 4 or send us a message through MyChart.   We are unable to tell what your co-pay for medications will be in advance as this is different depending on your insurance coverage. However, we may be able to find a substitute medication at lower cost or fill out paperwork to get insurance to cover a needed medication.   If a prior authorization is required to get your medication covered by your insurance company, please allow us 1-2 business days to complete this process.  Drug prices often vary depending on where the prescription is filled and some pharmacies may offer cheaper prices.  The website www.goodrx.com contains coupons for medications through different pharmacies. The prices here do not account for what the cost may be with help from insurance (it may be cheaper with your insurance), but the website can give you the price if you did not use any insurance.  - You can print the associated coupon and take it with   your prescription to the pharmacy.  - You may also stop by our office during regular business hours and pick up a GoodRx coupon card.  - If you need your prescription sent electronically to a different pharmacy, notify our office through [place] MyChart or by phone at [phone] option 4.     Si Usted Necesita Algo Despus de Su Visita  Tambin puede enviarnos un mensaje a travs de MyChart. Por lo general respondemos a los mensajes de MyChart en el transcurso de 1 a 2  das hbiles.  Para renovar recetas, por favor pida a su farmacia que se ponga en contacto con nuestra oficina. Nuestro nmero de fax es el [phone].  Si tiene un asunto urgente cuando la clnica est cerrada y que no puede esperar hasta el siguiente da hbil, puede llamar/localizar a su doctor(a) al nmero que aparece a continuacin.   Por favor, tenga en cuenta que aunque hacemos todo lo posible para estar disponibles para asuntos urgentes fuera del horario de oficina, no estamos disponibles las 24 horas del da, los 7 das de la semana.   Si tiene un problema urgente y no puede comunicarse con nosotros, puede optar por buscar atencin mdica  en el consultorio de su doctor(a), en una clnica privada, en un centro de atencin urgente o en una sala de emergencias.  Si tiene una emergencia mdica, por favor llame inmediatamente al 911 o vaya a la sala de emergencias.  Nmeros de bper  - Dr. Kowalski: [phone]  - Dra. Moye: [phone]  - Dra. Stewart: [phone]  En caso de inclemencias del tiempo, por favor llame a nuestra lnea principal al [phone] para una actualizacin sobre el estado de cualquier retraso o cierre.  Consejos para la medicacin en dermatologa: Por favor, guarde las cajas en las que vienen los medicamentos de uso tpico para ayudarle a seguir las instrucciones sobre dnde y cmo usarlos. Las farmacias generalmente imprimen las instrucciones del medicamento slo en las cajas y no directamente en los tubos del medicamento.   Si su medicamento es muy caro, por favor, pngase en contacto con nuestra oficina llamando al [phone] y presione la opcin 4 o envenos un mensaje a travs de MyChart.   No podemos decirle cul ser su copago por los medicamentos por adelantado ya que esto es diferente dependiendo de la cobertura de su seguro. Sin embargo, es posible que podamos encontrar un medicamento sustituto a menor costo o llenar un formulario para que el  seguro cubra el medicamento que se considera necesario.   Si se requiere una autorizacin previa para que su compaa de seguros cubra su medicamento, por favor permtanos de 1 a 2 das hbiles para completar este proceso.  Los precios de los medicamentos varan con frecuencia dependiendo del lugar de dnde se surte la receta y alguna farmacias pueden ofrecer precios ms baratos.  El sitio web www.goodrx.com tiene cupones para medicamentos de diferentes farmacias. Los precios aqu no tienen en cuenta lo que podra costar con la ayuda del seguro (puede ser ms barato con su seguro), pero el sitio web puede darle el precio si no utiliz ningn seguro.  - Puede imprimir el cupn correspondiente y llevarlo con su receta a la farmacia.  - Tambin puede pasar por nuestra oficina durante el horario de atencin regular y recoger una tarjeta de cupones de GoodRx.  - Si necesita que su receta se enve electrnicamente a una farmacia diferente, informe a nuestra oficina a travs de MyChart de [place]   o por telfono llamando al [phone] y presione la opcin 4.  

## 2023-03-07 NOTE — Progress Notes (Signed)
   Follow-Up Visit   Subjective  Tina Allen is a 57 y.o. female who presents for the following: filler for facial elastosis And for botox   The following portions of the chart were reviewed this encounter and updated as appropriate: medications, allergies, medical history  Review of Systems:  No other skin or systemic complaints except as noted in HPI or Assessment and Plan.  Objective  Well appearing patient in no apparent distress; mood and affect are within normal limits.  A focused examination was performed of the face. Relevant physical exam findings are noted in the Assessment and Plan or shown in photos.     Before photos                   Injection map photo                  Assessment & Plan    Facial Elastosis  Prior to the procedure, the patient's past medical history, allergies and the rare but potential risks and complications were reviewed with the patient and a signed consent was obtained. Pre and post-treatment care was discussed and instructions provided.   Location: cheeks, marionette lines, and chin  Filler Type: Restylane refyne  Procedure: The area was prepped thoroughly with Puracyn. After introducing the needle into the desired treatment area, the syringe plunger was drawn back to ensure there was no flash of blood prior to injecting the filler in order to minimize risk of intravascular injection and vascular occlusion. After injection of the filler, the treated areas were cleansed and iced to reduce swelling. Post-treatment instructions were reviewed with the patient.       Restylane Refyne Lot [id number] Exp 8- 31-2025  Patient tolerated the procedure well. The patient will call with any problems, questions or concerns prior to their next appointment.  FACIAL ELASTOSIS Exam: Rhytides and volume loss.  Treatment Plan: Patient injected with 45 units of botox Frown complex 20 units Forehead 5 units Crow's feet 10  units x 2  Recommend daily broad spectrum sunscreen SPF 30+ to sun-exposed areas, reapply every 2 hours as needed. Call for new or changing lesions.  Staying in the shade or wearing long sleeves, sun glasses (UVA+UVB protection) and wide brim hats (4-inch brim around the entire circumference of the hat) are also recommended for sun protection.   Facial Elastosis  Location:  see photo  Informed consent: Discussed risks (infection, pain, bleeding, bruising, swelling, allergic reaction, paralysis of nearby muscles, eyelid droop, double vision, neck weakness, difficulty breathing, headache, undesirable cosmetic result, and need for additional treatment) and benefits of the procedure, as well as the alternatives.  Informed consent was obtained.  Preparation: The area was cleansed with alcohol.  Procedure Details:  Botox was injected into the dermis with a 30-gauge needle. Pressure applied to any bleeding. Ice packs offered for swelling.  Lot Number:  [id number] Expiration:  01/2025  Total Units Injected:  45 units  Plan: Tylenol may be used for headache.  Allow 2 weeks before returning to clinic for additional dosing as needed. Patient will call for any problems.   Return for 4 month botox follow up.  IAsher Muir, CMA, am acting as scribe for Armida Sans, MD.   Documentation: I have reviewed the above documentation for accuracy and completeness, and I agree with the above.  Armida Sans, MD

## 2023-03-08 ENCOUNTER — Encounter: Payer: Self-pay | Admitting: Dermatology

## 2023-03-11 ENCOUNTER — Encounter: Payer: Self-pay | Admitting: Obstetrics and Gynecology

## 2023-03-11 ENCOUNTER — Ambulatory Visit: Payer: BC Managed Care – PPO | Admitting: Obstetrics and Gynecology

## 2023-03-11 VITALS — BP 122/77 | HR 79 | Ht 64.2 in | Wt 153.6 lb

## 2023-03-11 DIAGNOSIS — N816 Rectocele: Secondary | ICD-10-CM

## 2023-03-11 DIAGNOSIS — R350 Frequency of micturition: Secondary | ICD-10-CM

## 2023-03-11 DIAGNOSIS — N814 Uterovaginal prolapse, unspecified: Secondary | ICD-10-CM | POA: Diagnosis not present

## 2023-03-11 DIAGNOSIS — N393 Stress incontinence (female) (male): Secondary | ICD-10-CM | POA: Diagnosis not present

## 2023-03-11 DIAGNOSIS — N3281 Overactive bladder: Secondary | ICD-10-CM | POA: Diagnosis not present

## 2023-03-11 LAB — POCT URINALYSIS DIPSTICK
Bilirubin, UA: NEGATIVE
Glucose, UA: NEGATIVE
Ketones, UA: NEGATIVE
Leukocytes, UA: NEGATIVE
Nitrite, UA: NEGATIVE
Protein, UA: NEGATIVE
Spec Grav, UA: 1.025 (ref 1.010–1.025)
Urobilinogen, UA: 0.2 E.U./dL
pH, UA: 5.5 (ref 5.0–8.0)

## 2023-03-11 NOTE — Patient Instructions (Addendum)
Today we talked about ways to manage bladder urgency such as altering your diet to avoid irritative beverages and foods (bladder diet) as well as attempting to decrease stress and other exacerbating factors.  There is a website with helpful information for people with bladder irritation, called the IC Network at https://www.ic-network.com. This website has more information about a healthy bladder diet and patient forums for support.  The Most Bothersome Foods* The Least Bothersome Foods*  Coffee - Regular & Decaf Tea - caffeinated Carbonated beverages - cola, non-colas, diet & caffeine-free Alcohols - Beer, Red Wine, White Wine, [address] - Grapefruit, [place], Orange, [employer] - Cranberry, Grapefruit, Orange, Pineapple Vegetables - Tomato & Tomato Products Flavor Enhancers - Hot peppers, Spicy foods, Chili, Horseradish, Vinegar, Monosodium glutamate (MSG) Artificial Sweeteners - NutraSweet, Sweet 'N Low, Equal (sweetener), Saccharin Ethnic foods - [place], [place], [place] food [employer] - low-fat & whole Fruits - Bananas, Blueberries, Honeydew melon, Pears, Raisins, Watermelon Vegetables - Broccoli, [address] Sprouts, [place], Carrots, Cauliflower, [place], Cucumber, Mushrooms, Peas, Radishes, Squash, Zucchini, White potatoes, Sweet potatoes & yams Poultry - Chicken, Eggs, [place], [employer] - Beef, Diplomatic Services operational officer, Lamb Seafood - Shrimp, [place] fish, Salmon Grains - Oat, Rice Snacks - Pretzels, Popcorn  *Lenward Chancellor et al. Diet and its role in interstitial cystitis/bladder pain syndrome (IC/BPS) and comorbid conditions. [employer]. BJU Int. 2012 Jan 11.    You can consider adding in up to 5gm a day of pumpkin seed extract to help with overactive bladder.   You have stage 1 out of 4 uterine prolapse and stage 2 out of 4 posterior wall prolapse.

## 2023-03-11 NOTE — Progress Notes (Signed)
[facility] New Patient Evaluation and Consultation  Referring Provider: Mort Sawyers, FNP PCP: Mort Sawyers, FNP Date of Service: 03/11/2023  SUBJECTIVE Chief Complaint: New Patient (Initial Visit) Tina Allen is a 57 y.o. female is here for possible prolapse.)  History of Present Illness: Tina Allen is a 57 y.o. White or Caucasian female seen in consultation at the request of Dr. Alfonse Alpers for evaluation of prolapse.    Review of records significant for: US abdomen complete 03/08/15  Right Kidney: Length: 10.3 cm. Echogenicity within normal limits. No mass or hydronephrosis visualized.   Left Kidney: Length: 10 cm. Echogenicity within normal limits. No mass or hydronephrosis visualized.   Abdominal aorta: No aneurysm visualized.   Other findings: There is no ascites.    Urinary Symptoms: Leaks urine with cough/ sneeze Leaks 1 time(s) per days.  Pad use: None   She is bothered by her UI symptoms.  Day time voids 4.  Nocturia: 1 times per night to void. Voiding dysfunction: she empties her bladder well.  does not use a catheter to empty bladder.  When urinating, she feels she has no difficulties Drinks: 60-80oz Water, water with lemon, and occasional lemonade per day  UTIs: 0 UTI's in the last year.   Denies history of blood in urine, kidney or bladder stones, pyelonephritis, bladder cancer, and kidney cancer  Pelvic Organ Prolapse Symptoms:                  She Admits to a feeling of a bulge the vaginal area. It has been present for 5 years.  She Admits to seeing a bulge.  This bulge is bothersome.  Bowel Symptom: Bowel movements: 1 time(s) per day Stool consistency: soft  Straining: no.  Splinting: no.  Incomplete evacuation: no.  She Denies accidental bowel leakage / fecal incontinence Bowel regimen: none Last colonoscopy: Date 01/15/22, Results Normal  Sexual Function Sexually active: no.  Sexual orientation: Straight Pain with sex:  No  Pelvic Pain Denies pelvic pain    Past Medical History:  Past Medical History:  Diagnosis Date   COVID-19 08/2020   Dysplastic nevus 09/03/2019   Left hip/flank above waistline. Moderate atypia, limited margins free.    HSV-2 (herpes simplex virus 2) infection 2006   Vitamin D deficiency      Past Surgical History:   Past Surgical History:  Procedure Laterality Date   CERVICAL BIOPSY  W/ LOOP ELECTRODE EXCISION  1996   DYSPLASIA   COLONOSCOPY N/A 01/15/2022   Procedure: COLONOSCOPY;  Surgeon: Toney Reil, MD;  Location: [facility] ENDOSCOPY;  Service: Gastroenterology;  Laterality: N/A;     Past OB/GYN History: G2 P1 Vaginal deliveries: 1,  Forceps/ Vacuum deliveries: 0, Cesarean section: 0 Menopausal: Yes, at age 28 Last pap smear was 05/03/2020.  Any history of abnormal pap smears: no.   Medications: She has a current medication list which includes the following prescription(s): alprazolam, bimatoprost, clobetasol cream, estradiol, mometasone, progesterone, and tretinoin.   Allergies: Patient has No Known Allergies.   Social History:  Social History   Tobacco Use   Smoking status: Never   Smokeless tobacco: Never  Vaping Use   Vaping status: Never Used  Substance Use Topics   Alcohol use: Yes    Comment: 4 drinks a week   Drug use: No    Relationship status: divorced She lives by herself.   She is employed as a Veterinary surgeon. Regular exercise: No History of abuse: No  Family History:  Family History  Problem Relation Age of Onset   Aneurysm Mother        passed age 56   Hypertension Mother    Stroke Father        passed age 19 , smoker   Alcohol abuse Father    Alcohol abuse Brother    Hypertension Maternal Grandmother    Breast cancer Maternal Grandmother        diagnosed in her 72's   Hypertension Maternal Grandfather    Cancer Maternal Grandfather        unsure, brain?   Hypertension Maternal Aunt      Review of Systems: Review of  Systems  Constitutional:  Negative for fever, malaise/fatigue and weight loss.  Respiratory:  Negative for cough, sputum production and shortness of breath.   Cardiovascular:  Negative for chest pain, palpitations and leg swelling.  Gastrointestinal:  Negative for abdominal pain, blood in stool and constipation.  Genitourinary:  Negative for dysuria, hematuria and urgency.  Neurological:  Negative for dizziness, weakness and headaches.  Endo/Heme/Allergies:  Does not bruise/bleed easily.  Psychiatric/Behavioral:  Negative for depression and suicidal ideas. The patient is not nervous/anxious.      OBJECTIVE Physical Exam: Vitals:   03/11/23 1327  BP: 122/77  Pulse: 79  Weight: 153 lb 9.6 oz (69.7 kg)  Height: 5' 4.2" (1.631 m)    Physical Exam Constitutional:      Appearance: Normal appearance.  Pulmonary:     Effort: Pulmonary effort is normal.  Skin:    General: Skin is warm and dry.  Neurological:     Mental Status: She is alert and oriented to person, place, and time.  Psychiatric:        Mood and Affect: Mood normal.        Behavior: Behavior normal.        Thought Content: Thought content normal.      GU / Detailed Urogynecologic Evaluation:  Pelvic Exam: Normal external female genitalia; Bartholin's and Skene's glands normal in appearance; urethral meatus normal in appearance, no urethral masses or discharge.   CST: negative  Speculum exam reveals normal vaginal mucosa without atrophy. Cervix normal appearance. Uterus normal single, nontender. Adnexa normal adnexa.    With apex supported, anterior compartment defect was reduced  Pelvic floor strength II/V  Pelvic floor musculature: Right levator non-tender, Right obturator non-tender, Left levator non-tender, Left obturator non-tender  POP-Q:   POP-Q  -2                                            Aa   -2                                           Ba  -5                                              C    4  Gh  4.5                                            Pb  9                                            tvl   -0.5                                            Ap  -0.5                                            Bp  -6.5                                              D      Rectal Exam:  Normal sphincter tone, moderate distal rectocele, enterocoele not present, no rectal masses, no sign of dyssynergia when asking the patient to bear down.  Post-Void Residual (PVR) by Bladder Scan: In order to evaluate bladder emptying, we discussed obtaining a postvoid residual and she agreed to this procedure.  Procedure: The ultrasound unit was placed on the patient's abdomen in the suprapubic region after the patient had voided. A PVR of 0 ml was obtained by bladder scan.  Laboratory Results: POC Urine:   ASSESSMENT AND PLAN Ms. Gootee is a 57 y.o. with:  1. Posterior vaginal wall prolapse   2. Uterine prolapse   3. SUI (stress urinary incontinence, female)   4. OAB (overactive bladder)   5. Urinary frequency    POP Patient has stage I/IV anterior vaginal wall prolapse, stage I-II/IV Uterine Prolapse, and stage II/IV Posterior vaginal wall prolapse. For treatment of pelvic organ prolapse, we discussed options for management including expectant management, conservative management, and surgical management, such as Kegels, a pessary, pelvic floor physical therapy, and specific surgical procedures. She is most interested in pelvic floor PT and was given both surgical and non surgical information including information on Posterior vaginal wall repair, Sacrocolpopexy, and sacrospinous ligament fixation. Also printed information was given about pessary use for her to consider. Referral placed to [facility] pelvic floor PT as she is located in [place].   SUI Negative CST today on exam but patient reports she does have some loss of urine with heavy cough/sneeze.  We discussed options for leakage including Urethral bulking and Mid-urethral sling. She is unsure if she wishes to have an intervention at this time for this.If she chooses to have prolapse repair, she would need a simple CMG.   OAB/Urinary Frequency 3. Not as bothersome to patient but she was previously prescribed medication which she reports she did not wish to take. We discussed the symptoms of overactive bladder (OAB), which include urinary urgency, urinary frequency, nocturia, with or without urge incontinence.  While we do not know the exact etiology of OAB, several treatment options exist. We discussed management including  behavioral therapy (decreasing bladder irritants, urge suppression strategies, timed voids, bladder retraining), physical therapy, medication; for refractory cases posterior tibial nerve stimulation, sacral neuromodulation, and intravesical botulinum toxin injection. As she is not interested in a medication today, we discussed decreasing her bladder irritants (Lemon in water and lemonade). Also discussed she could consider starting pumpkin seed extract up to 5gm daily for prevention of OAB.    Patient to follow up as needed or if she would like to pursue a surgical repair can be scheduled with surgeon for surgical planning discussion.   Selmer Dominion, NP

## 2023-03-21 ENCOUNTER — Ambulatory Visit (INDEPENDENT_AMBULATORY_CARE_PROVIDER_SITE_OTHER): Payer: BC Managed Care – PPO | Admitting: Internal Medicine

## 2023-03-21 ENCOUNTER — Encounter: Payer: Self-pay | Admitting: Internal Medicine

## 2023-03-21 VITALS — BP 110/80 | HR 67 | Temp 97.5°F | Ht 64.25 in | Wt 156.0 lb

## 2023-03-21 DIAGNOSIS — Z79890 Hormone replacement therapy (postmenopausal): Secondary | ICD-10-CM

## 2023-03-21 DIAGNOSIS — K644 Residual hemorrhoidal skin tags: Secondary | ICD-10-CM | POA: Diagnosis not present

## 2023-03-21 MED ORDER — HYDROCORTISONE 2.5 % EX CREA: TOPICAL_CREAM | Freq: Three times a day (TID) | CUTANEOUS | 3 refills | Status: AC | PRN

## 2023-03-21 NOTE — Progress Notes (Signed)
Subjective:    Patient ID: Tina Allen, female    DOB: 1966-03-07, 57 y.o.   MRN: [id number]  HPI Here due to rectal pain---hemorrhoids  Is on hormone replacement for menopausal symptoms Worked fine but then she stopped prometrium (thought she was supposed to) Started having a period-- ~9 days ago Finally done--but had to strain to try to get the blood out  She noticed strain by rectum Known hemorrhoids Using preparation H suppository---helped some Can't tell if bleeding due to the vaginal blood  Bowels moving regularly  Current Outpatient Medications on File Prior to Visit  Medication Sig Dispense Refill   ALPRAZolam (XANAX) 0.25 MG tablet TAKE 1 TABLET BY MOUTH AT BEDTIME AS NEEDED FOR ANXIETY 30 tablet 0   bimatoprost (LATISSE) 0.03 % ophthalmic solution PLACE 1 APPLICATION INTO BOTH EYES AS DIRECTED. PLACE ONE DROP ON APPLICATOR AND APPLY EVENLY ALONG THE SKIN OF THE UPPER EYELID AT BASE OF EYELASHES ONCE DAILY AT BEDTIME REPEAT PROCEDURE FOR SECOND EYE (USE A CLEAN APPLICATOR). 5 mL 0   clobetasol cream (TEMOVATE) 0.05 % Apply 1 Application topically 2 (two) times daily. 30 g 0   estradiol (ESTRACE) 0.5 MG tablet Take 1 tablet (0.5 mg total) by mouth daily. 90 tablet 0   mometasone (ELOCON) 0.1 % cream Apply to affected skin qd-bid 15 g 0   progesterone (PROMETRIUM) 100 MG capsule Take 1 capsule (100 mg total) by mouth at bedtime. 90 capsule 4   No current facility-administered medications on file prior to visit.    No Known Allergies  Past Medical History:  Diagnosis Date   COVID-19 08/2020   Dysplastic nevus 09/03/2019   Left hip/flank above waistline. Moderate atypia, limited margins free.    HSV-2 (herpes simplex virus 2) infection 2006   Vitamin D deficiency     Past Surgical History:  Procedure Laterality Date   CERVICAL BIOPSY  W/ LOOP ELECTRODE EXCISION  1996   DYSPLASIA   COLONOSCOPY N/A 01/15/2022   Procedure: COLONOSCOPY;  Surgeon: Toney Reil, MD;  Location: [facility] ENDOSCOPY;  Service: Gastroenterology;  Laterality: N/A;    Family History  Problem Relation Age of Onset   Aneurysm Mother        passed age 26   Hypertension Mother    Stroke Father        passed age 48 , smoker   Alcohol abuse Father    Alcohol abuse Brother    Hypertension Maternal Grandmother    Breast cancer Maternal Grandmother        diagnosed in her 52's   Hypertension Maternal Grandfather    Cancer Maternal Grandfather        unsure, brain?   Hypertension Maternal Aunt     Social History   Socioeconomic History   Marital status: Divorced    Spouse name: Not on file   Number of children: 1   Years of education: Scientist, product/process development college   Highest education level: Not on file  Occupational History   Not on file  Tobacco Use   Smoking status: Never   Smokeless tobacco: Never  Vaping Use   Vaping status: Never Used  Substance and Sexual Activity   Alcohol use: Yes    Comment: 4 drinks a week   Drug use: No   Sexual activity: Not Currently    Partners: Male    Birth control/protection: Abstinence, Post-menopausal  Other Topics Concern   Not on file  Social History Narrative   09/30/19  From: [place]   Living: with daughter   Work: Veterinary surgeon with [employer]      Family: Daughter - Connye Burkitt (2003) - good relationship       Enjoys: travel, got to the beach/mountains      Exercise: not currently, achy knees   Diet: working on healthy eating now      IT sales professional belts: Yes    Guns: No   Safe in relationships: Yes    Social Determinants of Health   Financial Resource Strain: Low Risk  (03/21/2023)   Overall Financial Resource Strain (CARDIA)    Difficulty of Paying Living Expenses: Not very hard  Food Insecurity: No Food Insecurity (03/21/2023)   Hunger Vital Sign    Worried About Running Out of Food in the Last Year: Never true    Ran Out of Food in the Last Year: Never true  Transportation Needs: Not on file  Physical Activity:  Insufficiently Active (03/21/2023)   Exercise Vital Sign    Days of Exercise per Week: 3 days    Minutes of Exercise per Session: 30 min  Stress: No Stress Concern Present (03/21/2023)   [employer] of Occupational Health - Occupational Stress Questionnaire    Feeling of Stress : Only a little  Social Connections: Not on file  Intimate Partner Violence: Not on file   Review of Systems Appetite fine No abdominal pain     Objective:   Physical Exam Genitourinary:    Comments: Large hemorrhoids on right side of rectum Bluish discoloration in the cephalad one--but no true thrombosis           Assessment & Plan:

## 2023-03-21 NOTE — Assessment & Plan Note (Signed)
Discussed that it would be best to use the prometrium 100mg  every day while on the estrogen

## 2023-03-21 NOTE — Assessment & Plan Note (Signed)
Discussed witch hazel to wipe Hydrocortisone tid till improved Can use the suppositories still

## 2023-03-31 ENCOUNTER — Other Ambulatory Visit: Payer: Self-pay | Admitting: Family

## 2023-03-31 DIAGNOSIS — M1990 Unspecified osteoarthritis, unspecified site: Secondary | ICD-10-CM

## 2023-05-16 ENCOUNTER — Ambulatory Visit: Payer: BC Managed Care – PPO | Admitting: Dermatology

## 2023-05-16 VITALS — BP 115/79 | HR 60

## 2023-05-16 DIAGNOSIS — D489 Neoplasm of uncertain behavior, unspecified: Secondary | ICD-10-CM

## 2023-05-16 DIAGNOSIS — Z7189 Other specified counseling: Secondary | ICD-10-CM

## 2023-05-16 DIAGNOSIS — Z1283 Encounter for screening for malignant neoplasm of skin: Secondary | ICD-10-CM | POA: Diagnosis not present

## 2023-05-16 DIAGNOSIS — D225 Melanocytic nevi of trunk: Secondary | ICD-10-CM

## 2023-05-16 DIAGNOSIS — Z86018 Personal history of other benign neoplasm: Secondary | ICD-10-CM

## 2023-05-16 DIAGNOSIS — L988 Other specified disorders of the skin and subcutaneous tissue: Secondary | ICD-10-CM

## 2023-05-16 DIAGNOSIS — L578 Other skin changes due to chronic exposure to nonionizing radiation: Secondary | ICD-10-CM | POA: Diagnosis not present

## 2023-05-16 DIAGNOSIS — W908XXA Exposure to other nonionizing radiation, initial encounter: Secondary | ICD-10-CM

## 2023-05-16 DIAGNOSIS — Z79899 Other long term (current) drug therapy: Secondary | ICD-10-CM

## 2023-05-16 DIAGNOSIS — D229 Melanocytic nevi, unspecified: Secondary | ICD-10-CM

## 2023-05-16 DIAGNOSIS — L821 Other seborrheic keratosis: Secondary | ICD-10-CM

## 2023-05-16 DIAGNOSIS — L814 Other melanin hyperpigmentation: Secondary | ICD-10-CM

## 2023-05-16 MED ORDER — TRETINOIN 0.025 % EX CREA
TOPICAL_CREAM | CUTANEOUS | 11 refills | Status: AC
Start: 2023-05-16 — End: ?

## 2023-05-16 NOTE — Patient Instructions (Addendum)
Biopsy Wound Care Instructions  Leave the original bandage on for 24 hours if possible.  If the bandage becomes soaked or soiled before that time, it is OK to remove it and examine the wound.  A small amount of post-operative bleeding is normal.  If excessive bleeding occurs, remove the bandage, place gauze over the site and apply continuous pressure (no peeking) over the area for 30 minutes. If this does not work, please call our clinic as soon as possible or page your doctor if it is after hours.   Once a day, cleanse the wound with soap and water. It is fine to shower. If a thick crust develops you may use a Q-tip dipped into dilute hydrogen peroxide (mix 1:1 with water) to dissolve it.  Hydrogen peroxide can slow the healing process, so use it only as needed.    After washing, apply petroleum jelly (Vaseline) or an antibiotic ointment if your doctor prescribed one for you, followed by a bandage.    For best healing, the wound should be covered with a layer of ointment at all times. If you are not able to keep the area covered with a bandage to hold the ointment in place, this may mean re-applying the ointment several times a day.  Continue this wound care until the wound has healed and is no longer open.   Itching and mild discomfort is normal during the healing process. However, if you develop pain or severe itching, please call our office.   If you have any discomfort, you can take Tylenol (acetaminophen) or ibuprofen as directed on the bottle. (Please do not take these if you have an allergy to them or cannot take them for another reason).  Some redness, tenderness and white or yellow material in the wound is normal healing.  If the area becomes very sore and red, or develops a thick yellow-green material (pus), it may be infected; please notify us.    If you have stitches, return to clinic as directed to have the stitches removed. You will continue wound care for 2-3 days after the stitches  are removed.   Wound healing continues for up to one year following surgery. It is not unusual to experience pain in the scar from time to time during the interval.  If the pain becomes severe or the scar thickens, you should notify the office.    A slight amount of redness in a scar is expected for the first six months.  After six months, the redness will fade and the scar will soften and fade.  The color difference becomes less noticeable with time.  If there are any problems, return for a post-op surgery check at your earliest convenience.  To improve the appearance of the scar, you can use silicone scar gel, cream, or sheets (such as Mederma or Serica) every night for up to one year. These are available over the counter (without a prescription).  Please call our office at [phone] for any questions or concerns.       Start tretinoin 0.025 % cream apply pea sized amount to face and hands nightly as tolerated.  Can use followed by moisturizer either before or after   Topical retinoid medications like tretinoin/Retin-A, adapalene/Differin, tazarotene/Fabior, and Epiduo/Epiduo Forte can cause dryness and irritation when first started. Only apply a pea-sized amount to the entire affected area. Avoid applying it around the eyes, edges of mouth and creases at the nose. If you experience irritation, use a good moisturizer first  and/or apply the medicine less often. If you are doing well with the medicine, you can increase how often you use it until you are applying every night. Be careful with sun protection while using this medication as it can make you sensitive to the sun. This medicine should not be used by pregnant women.      Melanoma ABCDEs  Melanoma is the most dangerous type of skin cancer, and is the leading cause of death from skin disease.  You are more likely to develop melanoma if you: Have light-colored skin, light-colored eyes, or red or blond hair Spend a lot of time in  the sun Tan regularly, either outdoors or in a tanning bed Have had blistering sunburns, especially during childhood Have a close family member who has had a melanoma Have atypical moles or large birthmarks  Early detection of melanoma is key since treatment is typically straightforward and cure rates are extremely high if we catch it early.   The first sign of melanoma is often a change in a mole or a new dark spot.  The ABCDE system is a way of remembering the signs of melanoma.  A for asymmetry:  The two halves do not match. B for border:  The edges of the growth are irregular. C for color:  A mixture of colors are present instead of an even brown color. D for diameter:  Melanomas are usually (but not always) greater than 6mm - the size of a pencil eraser. E for evolution:  The spot keeps changing in size, shape, and color.  Please check your skin once per month between visits. You can use a small mirror in front and a large mirror behind you to keep an eye on the back side or your body.   If you see any new or changing lesions before your next follow-up, please call to schedule a visit.  Please continue daily skin protection including broad spectrum sunscreen SPF 30+ to sun-exposed areas, reapplying every 2 hours as needed when you're outdoors.   Staying in the shade or wearing long sleeves, sun glasses (UVA+UVB protection) and wide brim hats (4-inch brim around the entire circumference of the hat) are also recommended for sun protection.    Due to recent changes in healthcare laws, you may see results of your pathology and/or laboratory studies on MyChart before the doctors have had a chance to review them. We understand that in some cases there may be results that are confusing or concerning to you. Please understand that not all results are received at the same time and often the doctors may need to interpret multiple results in order to provide you with the best plan of care or course  of treatment. Therefore, we ask that you please give [place] 2 business days to thoroughly review all your results before contacting the office for clarification. Should we see a critical lab result, you will be contacted sooner.   If You Need Anything After Your Visit  If you have any questions or concerns for your doctor, please call our main line at [phone] and press option 4 to reach your doctor's medical assistant. If no one answers, please leave a voicemail as directed and we will return your call as soon as possible. Messages left after 4 pm will be answered the following business day.   You may also send [place] a message via MyChart. We typically respond to MyChart messages within 1-2 business days.  For prescription refills, please ask your  pharmacy to contact our office. Our fax number is [phone].  If you have an urgent issue when the clinic is closed that cannot wait until the next business day, you can page your doctor at the number below.    Please note that while we do our best to be available for urgent issues outside of office hours, we are not available 24/7.   If you have an urgent issue and are unable to reach [place], you may choose to seek medical care at your doctor's office, retail clinic, urgent care center, or emergency room.  If you have a medical emergency, please immediately call 911 or go to the emergency department.  Pager Numbers  - Dr. Gwen Pounds: [phone]  - Dr. Roseanne Reno: [phone]  - Dr. Katrinka Blazing: [phone]   In the event of inclement weather, please call our main line at [phone] for an update on the status of any delays or closures.  Dermatology Medication Tips: Please keep the boxes that topical medications come in in order to help keep track of the instructions about where and how to use these. Pharmacies typically print the medication instructions only on the boxes and not directly on the medication tubes.   If your medication is too  expensive, please contact our office at [phone] option 4 or send [place] a message through MyChart.   We are unable to tell what your co-pay for medications will be in advance as this is different depending on your insurance coverage. However, we may be able to find a substitute medication at lower cost or fill out paperwork to get insurance to cover a needed medication.   If a prior authorization is required to get your medication covered by your insurance company, please allow [place] 1-2 business days to complete this process.  Drug prices often vary depending on where the prescription is filled and some pharmacies may offer cheaper prices.  The website www.goodrx.com contains coupons for medications through different pharmacies. The prices here do not account for what the cost may be with help from insurance (it may be cheaper with your insurance), but the website can give you the price if you did not use any insurance.  - You can print the associated coupon and take it with your prescription to the pharmacy.  - You may also stop by our office during regular business hours and pick up a GoodRx coupon card.  - If you need your prescription sent electronically to a different pharmacy, notify our office through [facility] or by phone at [phone] option 4.     Si Usted Necesita Algo Despus de Su Visita  Tambin puede enviarnos un mensaje a travs de Clinical cytogeneticist. Por lo general respondemos a los mensajes de MyChart en el transcurso de 1 a 2 das hbiles.  Para renovar recetas, por favor pida a su farmacia que se ponga en contacto con nuestra oficina. Annie Sable de fax es [place] [phone].  Si tiene un asunto urgente cuando la clnica est cerrada y que no puede esperar hasta el siguiente da hbil, puede llamar/localizar a su doctor(a) al nmero que aparece a continuacin.   Por favor, tenga en cuenta que aunque hacemos todo lo posible para estar disponibles para asuntos urgentes fuera  del horario de [place], no estamos disponibles las 24 horas del da, los 7 [address]  [address] de la [place].   Si tiene un problema urgente y no puede comunicarse con nosotros, puede optar por buscar atencin mdica  en el consultorio de su doctor(a),  en una clnica privada, en un centro de atencin urgente o en una sala de emergencias.  Si tiene Engineer, drilling, por favor llame inmediatamente al 911 o vaya a la sala de emergencias.  Nmeros de bper  - Dr. Gwen Pounds: [phone]  - Dra. Roseanne Reno: [phone]  - Dr. Katrinka Blazing: [phone]   En caso de inclemencias del tiempo, por favor llame a Lacy Duverney principal al [phone] para una actualizacin sobre el [place] de cualquier retraso o cierre.  Consejos para la medicacin en dermatologa: Por favor, guarde las cajas en las que vienen los medicamentos de uso tpico para ayudarle a seguir las instrucciones sobre dnde y cmo usarlos. Las farmacias generalmente imprimen las instrucciones del medicamento slo en las cajas y no directamente en los tubos del [place].   Si su medicamento es muy caro, por favor, pngase en contacto con Rolm [place] llamando al [phone] y presione la opcin 4 o envenos un mensaje a travs de Clinical cytogeneticist.   No podemos decirle cul ser su copago por los medicamentos por adelantado ya que esto es diferente dependiendo de la cobertura de su seguro. Sin embargo, es posible que podamos encontrar un medicamento sustituto a Audiological scientist un formulario para que el seguro cubra el medicamento que se considera necesario.   Si se requiere una autorizacin previa para que su compaa de seguros [place] su medicamento, por favor permtanos de 1 a 2 das hbiles para completar [address]  Los precios de los medicamentos varan con frecuencia dependiendo del Environmental consultant de dnde se surte la receta y alguna farmacias pueden ofrecer precios ms baratos.  El sitio web www.goodrx.com tiene cupones para medicamentos de  Health and safety inspector. Los precios aqu no tienen en cuenta lo que podra costar con la ayuda del seguro (puede ser ms barato con su seguro), pero el sitio web puede darle el precio si no utiliz Tourist information centre manager.  - Puede imprimir el cupn correspondiente y llevarlo con su receta a la farmacia.  - Tambin puede pasar por nuestra oficina durante el horario de atencin regular y Education officer, museum una tarjeta de cupones de GoodRx.  - Si necesita que su receta se enve electrnicamente a una farmacia diferente, informe a nuestra oficina a travs de MyChart de [place] o por telfono llamando al [phone] y presione la opcin 4.

## 2023-05-16 NOTE — Progress Notes (Signed)
Follow-Up Visit   Subjective  Tina Allen is a 57 y.o. female who presents for the following: Skin Cancer Screening and Full Body Skin Exam Hx of dysplastic   The patient presents for Total-Body Skin Exam (TBSE) for skin cancer screening and mole check. The patient has spots, moles and lesions to be evaluated, some may be new or changing and the patient may have concern these could be cancer.    The following portions of the chart were reviewed this encounter and updated as appropriate: medications, allergies, medical history  Review of Systems:  No other skin or systemic complaints except as noted in HPI or Assessment and Plan.  Objective  Well appearing patient in no apparent distress; mood and affect are within normal limits.  A full examination was performed including scalp, head, eyes, ears, nose, lips, neck, chest, axillae, abdomen, back, buttocks, bilateral upper extremities, bilateral lower extremities, hands, feet, fingers, toes, fingernails, and toenails. All findings within normal limits unless otherwise noted below.   Relevant physical exam findings are noted in the Assessment and Plan.  left mid back at braline 2 cm lateral to spine 0.6 cm irregular brown macule          Assessment & Plan   SKIN CANCER SCREENING PERFORMED TODAY.  ACTINIC DAMAGE - Chronic condition, secondary to cumulative UV/sun exposure - diffuse scaly erythematous macules with underlying dyspigmentation - Recommend daily broad spectrum sunscreen SPF 30+ to sun-exposed areas, reapply every 2 hours as needed.  - Staying in the shade or wearing long sleeves, sun glasses (UVA+UVB protection) and wide brim hats (4-inch brim around the entire circumference of the hat) are also recommended for sun protection.  - Call for new or changing lesions.  LENTIGINES, SEBORRHEIC KERATOSES, HEMANGIOMAS - Benign normal skin lesions - Benign-appearing - Call for any changes  MELANOCYTIC NEVI -  Tan-brown and/or pink-flesh-colored symmetric macules and papules - Benign appearing on exam today - Observation - Call clinic for new or changing moles - Recommend daily use of broad spectrum spf 30+ sunscreen to sun-exposed areas.   FACIAL ELASTOSIS Exam: Rhytides and volume loss.  Treatment Plan: Start tretinoin 0.025 apply topically to face nightly   Topical retinoid medications like tretinoin/Retin-A, adapalene/Differin, tazarotene/Fabior, and Epiduo/Epiduo Forte can cause dryness and irritation when first started. Only apply a pea-sized amount to the entire affected area. Avoid applying it around the eyes, edges of mouth and creases at the nose. If you experience irritation, use a good moisturizer first and/or apply the medicine less often. If you are doing well with the medicine, you can increase how often you use it until you are applying every night. Be careful with sun protection while using this medication as it can make you sensitive to the sun. This medicine should not be used by pregnant women.    Recommend daily broad spectrum sunscreen SPF 30+ to sun-exposed areas, reapply every 2 hours as needed. Call for new or changing lesions.  Staying in the shade or wearing long sleeves, sun glasses (UVA+UVB protection) and wide brim hats (4-inch brim around the entire circumference of the hat) are also recommended for sun protection.   Elastosis of skin  Related Medications tretinoin (RETIN-A) 0.025 % cream Apply pea sized amount to face qhs  Neoplasm of uncertain behavior left mid back at braline 2 cm lateral to spine  Epidermal / dermal shaving  Lesion diameter (cm):  0.6 Informed consent: discussed and consent obtained   Timeout: patient name, date of  birth, surgical site, and procedure verified   Procedure prep:  Patient was prepped and draped in usual sterile fashion Prep type:  Isopropyl alcohol Anesthesia: the lesion was anesthetized in a standard fashion   Anesthetic:   1% lidocaine w/ epinephrine 1-100,000 buffered w/ 8.4% NaHCO3 Instrument used: flexible razor blade   Hemostasis achieved with: pressure, aluminum chloride and electrodesiccation   Outcome: patient tolerated procedure well   Post-procedure details: sterile dressing applied and wound care instructions given   Dressing type: bandage and petrolatum    Specimen 1 - Surgical pathology Differential Diagnosis: Irregular nevus r/o dysplasia  Check Margins: No  Irregular nevus r/o dysplasia   Return in about 1 year (around 05/15/2024) for TBSE.  IAsher Muir, CMA, am acting as scribe for Armida Sans, MD.   Documentation: I have reviewed the above documentation for accuracy and completeness, and I agree with the above.  Armida Sans, MD

## 2023-05-20 LAB — SURGICAL PATHOLOGY

## 2023-05-21 ENCOUNTER — Telehealth: Payer: Self-pay

## 2023-05-21 ENCOUNTER — Encounter: Payer: Self-pay | Admitting: Dermatology

## 2023-05-21 ENCOUNTER — Other Ambulatory Visit: Payer: Self-pay | Admitting: Family

## 2023-05-21 NOTE — Telephone Encounter (Signed)
-----   Message from Armida Sans sent at 05/21/2023 10:43 AM EDT ----- FINAL DIAGNOSIS        1. Skin, left mid back at braline 2 cm lateral to spine :       DYSPLASTIC JUNCTIONAL LENTIGINOUS NEVUS WITH MODERATE ATYPIA, PERIPHERAL MARGIN       INVOLVED   Moderate dysplastic Recheck next visit

## 2023-05-21 NOTE — Telephone Encounter (Signed)
Discussed biopsy results with pt

## 2023-05-21 NOTE — Telephone Encounter (Signed)
Last OV: 01/01/2023 Pending OV: Nothing scheduled at this time Medication Medroxyprogesterone 5mg   Directions: Take 1 tablet daily, days 1-12 of each month Last Refill: 10/27/2021 (medication is not on current med list) Qty: #45 with 3 refills

## 2023-05-24 ENCOUNTER — Telehealth: Payer: Self-pay | Admitting: *Deleted

## 2023-05-24 DIAGNOSIS — N951 Menopausal and female climacteric states: Secondary | ICD-10-CM

## 2023-05-24 MED ORDER — PROGESTERONE MICRONIZED 100 MG PO CAPS: 100.0000 mg | ORAL_CAPSULE | Freq: Every day | ORAL | 4 refills | Status: DC

## 2023-05-24 MED ORDER — ESTRADIOL 0.5 MG PO TABS: 0.5000 mg | ORAL_TABLET | Freq: Every day | ORAL | 0 refills | Status: DC

## 2023-05-24 NOTE — Telephone Encounter (Signed)
Patient is not taking Prometrium 100 mg, per previous conversation. Patient declines OV until AEX 07/22/23.

## 2023-05-24 NOTE — Telephone Encounter (Signed)
Patient called requesting refill of HRT.  Call returned to patient.  Patient requesting Rx for estradiol 0.5 mh tab and Progesterone 5 mg tab (previously prescribed by PCP) Patient states she has 7 days of medication left, no longer prescribed by PCP.   Advised patient last OV 08/09/22 with ML, no AEX on file. Will need updated AEX for med change and refills. Scheduled for AEX 07/22/23 with Dr. Karma Greaser. Patient is requesting RX for both mediations until AEX. Last MMG.  01/10/23, followed by Left Dx on 01/24/23; BiRads 1 neg.   Advised I will send to provider for review. Advised provider is seeing patients and our office closes at 12, may not have response until Monday. Patient verbalizes understanding. Pharmacy updated.

## 2023-05-28 DIAGNOSIS — H10413 Chronic giant papillary conjunctivitis, bilateral: Secondary | ICD-10-CM | POA: Diagnosis not present

## 2023-05-28 DIAGNOSIS — H5213 Myopia, bilateral: Secondary | ICD-10-CM | POA: Diagnosis not present

## 2023-06-07 ENCOUNTER — Other Ambulatory Visit: Payer: Self-pay | Admitting: Obstetrics and Gynecology

## 2023-06-07 DIAGNOSIS — N951 Menopausal and female climacteric states: Secondary | ICD-10-CM

## 2023-06-07 NOTE — Telephone Encounter (Signed)
Med refill request: estradiol Last OV: 08/09/22 Next AEX: 07/22/23 Last MMG (if hormonal med) 01/24/23 Refill authorized: Please Advise, #[phone], only sent in two week supply

## 2023-07-16 ENCOUNTER — Encounter: Payer: Self-pay | Admitting: Dermatology

## 2023-07-16 ENCOUNTER — Ambulatory Visit (INDEPENDENT_AMBULATORY_CARE_PROVIDER_SITE_OTHER): Payer: Self-pay | Admitting: Dermatology

## 2023-07-16 DIAGNOSIS — L988 Other specified disorders of the skin and subcutaneous tissue: Secondary | ICD-10-CM

## 2023-07-16 NOTE — Progress Notes (Signed)
   Follow-Up Visit   Subjective  Tina Allen is a 57 y.o. female who presents for the following: Botox for facial elastosis  The following portions of the chart were reviewed this encounter and updated as appropriate: medications, allergies, medical history  Review of Systems:  No other skin or systemic complaints except as noted in HPI or Assessment and Plan.  Objective  Well appearing patient in no apparent distress; mood and affect are within normal limits.  A focused examination was performed of the face.  Relevant physical exam findings are noted in the Assessment and Plan.  Injection map photo     Assessment & Plan    Facial Elastosis Botox 47.5 units (typically only inject 45 units but had 2.5 extra so injected in frown complex free of charge) injected today to: - Frown complex 22.5 units (2.5 units no charge) - Forehead 5 units - Crow's feet 10 units x 2   Location: frown complex, forehead, bil crow's feet  Informed consent: Discussed risks (infection, pain, bleeding, bruising, swelling, allergic reaction, paralysis of nearby muscles, eyelid droop, double vision, neck weakness, difficulty breathing, headache, undesirable cosmetic result, and need for additional treatment) and benefits of the procedure, as well as the alternatives.  Informed consent was obtained.  Preparation: The area was cleansed with alcohol.  Procedure Details:  Botox was injected into the dermis with a 30-gauge needle. Pressure applied to any bleeding. Ice packs offered for swelling.  Lot Number:  [id number] Expiration:  04/2025  Total Units Injected:  47.5  Plan: Tylenol may be used for headache.  Allow 2 weeks before returning to clinic for additional dosing as needed. Patient will call for any problems.  Return for 3-76m Botox.  I, Ardis Rowan, RMA, am acting as scribe for Armida Sans, MD .   Documentation: I have reviewed the above documentation for accuracy and completeness, and  I agree with the above.  Armida Sans, MD

## 2023-07-16 NOTE — Patient Instructions (Signed)

## 2023-07-22 ENCOUNTER — Ambulatory Visit: Payer: BC Managed Care – PPO | Admitting: Obstetrics and Gynecology

## 2023-07-29 ENCOUNTER — Encounter: Payer: Self-pay | Admitting: Obstetrics and Gynecology

## 2023-07-29 ENCOUNTER — Ambulatory Visit (INDEPENDENT_AMBULATORY_CARE_PROVIDER_SITE_OTHER): Payer: BC Managed Care – PPO | Admitting: Obstetrics and Gynecology

## 2023-07-29 VITALS — BP 120/80 | HR 86 | Ht 66.5 in | Wt 154.0 lb

## 2023-07-29 DIAGNOSIS — Z01419 Encounter for gynecological examination (general) (routine) without abnormal findings: Secondary | ICD-10-CM | POA: Diagnosis not present

## 2023-07-29 DIAGNOSIS — N951 Menopausal and female climacteric states: Secondary | ICD-10-CM

## 2023-07-29 MED ORDER — PROGESTERONE MICRONIZED 100 MG PO CAPS: 100.0000 mg | ORAL_CAPSULE | Freq: Every day | ORAL | 1 refills | Status: DC

## 2023-07-29 MED ORDER — ESTRADIOL 0.5 MG PO TABS
ORAL_TABLET | ORAL | 0 refills | Status: DC
Start: 2023-07-29 — End: 2023-11-28

## 2023-07-29 NOTE — Assessment & Plan Note (Signed)
Cervical cancer screening performed according to ASCCP guidelines. Encouraged annual mammogram screening Colonoscopy UTD DXA never Labs and immunizations with her primary Encouraged safe sexual practices as indicated Encouraged healthy lifestyle practices with diet and exercise For patients under 50-59yo, I recommend 1200mg  calcium daily and 600IU of vitamin D daily.

## 2023-07-29 NOTE — Patient Instructions (Signed)
For patients under 50-59yo, I recommend 1200mg  calcium daily and 600IU of vitamin D daily. For patients over 59yo, I recommend 1200mg  calcium daily and 800IU of vitamin D daily.  Health Maintenance, Female Adopting a healthy lifestyle and getting preventive care are important in promoting health and wellness. Ask your health care provider about: The right schedule for you to have regular tests and exams. Things you can do on your own to prevent diseases and keep yourself healthy. What should I know about diet, weight, and exercise? Eat a healthy diet  Eat a diet that includes plenty of vegetables, fruits, low-fat dairy products, and lean protein. Do not eat a lot of foods that are high in solid fats, added sugars, or sodium. Maintain a healthy weight Body mass index (BMI) is used to identify weight problems. It estimates body fat based on height and weight. Your health care provider can help determine your BMI and help you achieve or maintain a healthy weight. Get regular exercise Get regular exercise. This is one of the most important things you can do for your health. Most adults should: Exercise for at least 150 minutes each week. The exercise should increase your heart rate and make you sweat (moderate-intensity exercise). Do strengthening exercises at least twice a week. This is in addition to the moderate-intensity exercise. Spend less time sitting. Even light physical activity can be beneficial. Watch cholesterol and blood lipids Have your blood tested for lipids and cholesterol at 59 years of age, then have this test every 5 years. Have your cholesterol levels checked more often if: Your lipid or cholesterol levels are high. You are older than 59 years of age. You are at high risk for heart disease. What should I know about cancer screening? Depending on your health history and family history, you may need to have cancer screening at various ages. This may include screening  for: Breast cancer. Cervical cancer. Colorectal cancer. Skin cancer. Lung cancer. What should I know about heart disease, diabetes, and high blood pressure? Blood pressure and heart disease High blood pressure causes heart disease and increases the risk of stroke. This is more likely to develop in people who have high blood pressure readings or are overweight. Have your blood pressure checked: Every 3-5 years if you are 83-59 years of age. Every year if you are 4 years old or older. Diabetes Have regular diabetes screenings. This checks your fasting blood sugar level. Have the screening done: Once every three years after age 68 if you are at a normal weight and have a low risk for diabetes. More often and at a younger age if you are overweight or have a high risk for diabetes. What should I know about preventing infection? Hepatitis B If you have a higher risk for hepatitis B, you should be screened for this virus. Talk with your health care provider to find out if you are at risk for hepatitis B infection. Hepatitis C Testing is recommended for: Everyone born from [birth year] through 1967. Anyone with known risk factors for hepatitis C. Sexually transmitted infections (STIs) Get screened for STIs, including gonorrhea and chlamydia, if: You are sexually active and are younger than 59 years of age. You are older than 59 years of age and your health care provider tells you that you are at risk for this type of infection. Your sexual activity has changed since you were last screened, and you are at increased risk for chlamydia or gonorrhea. Ask your health care provider if  you are at risk. Ask your health care provider about whether you are at high risk for HIV. Your health care provider may recommend a prescription medicine to help prevent HIV infection. If you choose to take medicine to prevent HIV, you should first get tested for HIV. You should then be tested every 3 months for as long as you  are taking the medicine. Osteoporosis and menopause Osteoporosis is a disease in which the bones lose minerals and strength with aging. This can result in bone fractures. If you are 44 years old or older, or if you are at risk for osteoporosis and fractures, ask your health care provider if you should: Be screened for bone loss. Take a calcium or vitamin D supplement to lower your risk of fractures. Be given hormone replacement therapy (HRT) to treat symptoms of menopause. Follow these instructions at home: Alcohol use Do not drink alcohol if: Your health care provider tells you not to drink. You are pregnant, may be pregnant, or are planning to become pregnant. If you drink alcohol: Limit how much you have to: 0-1 drink a day. Know how much alcohol is in your drink. In the [place] one drink equals one 12 oz bottle of beer (355 mL), one 5 oz glass of wine (148 mL), or one 1 oz glass of hard liquor (44 mL). Lifestyle Do not use any products that contain nicotine or tobacco. These products include cigarettes, chewing tobacco, and vaping devices, such as e-cigarettes. If you need help quitting, ask your health care provider. Do not use street drugs. Do not share needles. Ask your health care provider for help if you need support or information about quitting drugs. General instructions Schedule regular health, dental, and eye exams. Stay current with your vaccines. Tell your health care provider if: You often feel depressed. You have ever been abused or do not feel safe at home. Summary Adopting a healthy lifestyle and getting preventive care are important in promoting health and wellness. Follow your health care provider's instructions about healthy diet, exercising, and getting tested or screened for diseases. Follow your health care provider's instructions on monitoring your cholesterol and blood pressure. This information is not intended to replace advice given to you by your health  care provider. Make sure you discuss any questions you have with your health care provider. Document Revised: 01/02/2021 Document Reviewed: 01/02/2021 Elsevier Patient Education  2024 [employer].

## 2023-07-29 NOTE — Progress Notes (Signed)
57 y.o. 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 Realtor.  Wants to stop HRT, but attempted to wean and had VB 6 months ago, so she continued medications. No bleeding since.  Postmenopausal bleeding: see above Pelvic discharge or pain: none Breast mass, nipple discharge or skin changes : none Last PAP: No results found for: "DIAGPAP", "HPVHIGH", "ADEQPAP" 2021 NIL, HPV neg Last mammogram: 01/24/23 BIRADS 1, density c Last colonoscopy: 01/15/22, q81yr Last DXA: never Sexually active: no  Exercising: walks her dog ~30 mins a day, otherwise not active  GYN HISTORY: LEEP (2001), LSIL 2010, normal PAP since  OB History  Gravida Para Term Preterm AB Living  2 1 1   1 1   SAB IAB Ectopic Multiple Live Births  1       1    # Outcome Date GA Lbr Len/2nd Weight Sex Type Anes PTL Lv  2 SAB           1 Term     F Vag-Spont  N LIV    Past Medical History:  Diagnosis Date   COVID-19 08/2020   Dysplastic nevus 09/03/2019   Left hip/flank above waistline. Moderate atypia, limited margins free.    Dysplastic nevus 05/16/2023   left mid back at braline 2 cm lateral to spine, moderate   HSV-2 (herpes simplex virus 2) infection 2006   Vitamin D deficiency     Past Surgical History:  Procedure Laterality Date   CERVICAL BIOPSY  W/ LOOP ELECTRODE EXCISION  1996   DYSPLASIA   COLONOSCOPY N/A 01/15/2022   Procedure: COLONOSCOPY;  Surgeon: Toney Reil, MD;  Location: [facility] ENDOSCOPY;  Service: Gastroenterology;  Laterality: N/A;    Current Outpatient Medications on File Prior to Visit  Medication Sig Dispense Refill   ALPRAZolam (XANAX) 0.25 MG tablet TAKE 1 TABLET BY MOUTH AT BEDTIME AS NEEDED FOR ANXIETY 30 tablet 0   bimatoprost (LATISSE) 0.03 % ophthalmic solution PLACE 1 APPLICATION INTO BOTH EYES AS DIRECTED. PLACE ONE DROP ON APPLICATOR AND APPLY EVENLY ALONG THE SKIN OF THE UPPER EYELID AT BASE OF EYELASHES ONCE DAILY AT BEDTIME  REPEAT PROCEDURE FOR SECOND EYE (USE A CLEAN APPLICATOR). 5 mL 0   hydrocortisone 2.5 % cream Apply topically 3 (three) times daily as needed. 28 g 3   meloxicam (MOBIC) 7.5 MG tablet TAKE 1 TABLET BY MOUTH EVERY DAY 90 tablet 0   tretinoin (RETIN-A) 0.025 % cream Apply pea sized amount to face qhs 45 g 11   No current facility-administered medications on file prior to visit.    Social History   Socioeconomic History   Marital status: Divorced    Spouse name: Not on file   Number of children: 1   Years of education: Scientist, product/process development college   Highest education level: Not on file  Occupational History   Not on file  Tobacco Use   Smoking status: Never   Smokeless tobacco: Never  Vaping Use   Vaping status: Never Used  Substance and Sexual Activity   Alcohol use: Yes    Comment: 4 drinks a week   Drug use: No   Sexual activity: Not Currently    Partners: Male    Birth control/protection: Abstinence, Post-menopausal  Other Topics Concern   Not on file  Social History Narrative   09/30/19   From: [place]   Living: with daughter   Work: Veterinary surgeon with [employer]      Family: Daughter -  Ally (2003) - good relationship       Enjoys: travel, got to the beach[place]ains      Exercise: not currently, achy knees   Diet: working on healthy eating now      Safety   Seat belts: Yes    Guns: No   Safe in relationships: Yes    Social Determinants of Health   Financial Resource Strain: Low Risk  (03/21/2023)   Overall Financial Resource Strain (CARDIA)    Difficulty of Paying Living Expenses: Not very hard  Food Insecurity: No Food Insecurity (03/21/2023)   Hunger Vital Sign    Worried About Running Out of Food in the Last Year: Never true    Ran Out of Food in the Last Year: Never true  Transportation Needs: Not on file  Physical Activity: Insufficiently Active (03/21/2023)   Exercise Vital Sign    Days of Exercise per Week: 3 days    Minutes of Exercise per Session: 30 min  Stress: No  Stress Concern Present (03/21/2023)   [employer] of Occupational Health - Occupational Stress Questionnaire    Feeling of Stress : Only a little  Social Connections: Not on file  Intimate Partner Violence: Not on file    Family History  Problem Relation Age of Onset   Aneurysm Mother        passed age 29   Hypertension Mother    Stroke Father        passed age 73 , smoker   Alcohol abuse Father    Alcohol abuse Brother    Breast cancer Maternal Aunt    Hypertension Maternal Aunt    Hypertension Maternal Grandmother    Breast cancer Maternal Grandmother        diagnosed in her 45's   Hypertension Maternal Grandfather    Cancer Maternal Grandfather        unsure, brain?    No Known Allergies    PE Today's Vitals   07/29/23 1012  BP: 120/80  Pulse: 86  SpO2: 98%  Weight: 154 lb (69.9 kg)  Height: 5' 6.5" (1.689 m)   Body mass index is 24.48 kg/m.  Physical Exam Vitals reviewed. Exam conducted with a chaperone present.  Constitutional:      General: She is not in acute distress.    Appearance: Normal appearance.  HENT:     Head: Normocephalic and atraumatic.     Nose: Nose normal.  Eyes:     Extraocular Movements: Extraocular movements intact.     Conjunctiva/sclera: Conjunctivae normal.  Neck:     Thyroid: No thyroid mass, thyromegaly or thyroid tenderness.  Pulmonary:     Effort: Pulmonary effort is normal.  Chest:     Chest wall: No mass or tenderness.  Breasts:    Right: Normal. No swelling, mass, nipple discharge, skin change or tenderness.     Left: Normal. No swelling, mass, nipple discharge, skin change or tenderness.  Abdominal:     General: There is no distension.     Palpations: Abdomen is soft.     Tenderness: There is no abdominal tenderness.  Genitourinary:    General: Normal vulva.     Exam position: Lithotomy position.     Urethra: No prolapse.     Vagina: Normal. No vaginal discharge or bleeding.     Cervix: Normal. No lesion.      Uterus: Normal. Not enlarged and not tender.      Adnexa: Right adnexa normal and left adnexa normal.  Musculoskeletal:        General: Normal range of motion.     Cervical back: Normal range of motion.  Lymphadenopathy:     Upper Body:     Right upper body: No axillary adenopathy.     Left upper body: No axillary adenopathy.     Lower Body: No right inguinal adenopathy. No left inguinal adenopathy.  Skin:    General: Skin is warm and dry.  Neurological:     General: No focal deficit present.     Mental Status: She is alert.  Psychiatric:        Mood and Affect: Mood normal.        Behavior: Behavior normal.       Assessment and Plan:        Well woman exam with routine gynecological exam Assessment & Plan: Cervical cancer screening performed according to ASCCP guidelines. Encouraged annual mammogram screening Colonoscopy UTD DXA never Labs and immunizations with her primary Encouraged safe sexual practices as indicated Encouraged healthy lifestyle practices with diet and exercise For patients under 50-70yo, I recommend 1200mg  calcium daily and 600IU of vitamin D daily.    Hot flashes due to menopause -     Progesterone; Take 1 capsule (100 mg total) by mouth at bedtime.  Dispense: 90 capsule; Refill: 1 -     Estradiol; Take half a tablet my mouth daily for 4 weeks. Then decrease to half a tablet my mouth every other day for 4 weeks. Then half a tablet my mouth twice a week for 4 weeks.  Dispense: 90 tablet; Refill: 0  6 month supply provided, but taper planned for 3 months.  Rosalyn Gess, MD

## 2023-08-28 ENCOUNTER — Other Ambulatory Visit: Payer: Self-pay | Admitting: Obstetrics and Gynecology

## 2023-08-28 DIAGNOSIS — N951 Menopausal and female climacteric states: Secondary | ICD-10-CM

## 2023-08-29 NOTE — Telephone Encounter (Signed)
 Med refill request: Estrace Last AEX: 07/29/23 Next AEX: not scheduled Last MMG (if hormonal med) 01/24/23 Refill authorized: Please Advise, #30, 1 RF

## 2023-09-02 ENCOUNTER — Telehealth: Payer: Self-pay

## 2023-09-02 MED ORDER — BIMATOPROST 0.03 % EX SOLN: 1.0000 | Freq: Every day | CUTANEOUS | 12 refills | Status: DC

## 2023-09-02 NOTE — Telephone Encounter (Signed)
 RF sent in to pharmacy. aw

## 2023-09-02 NOTE — Telephone Encounter (Signed)
 Patient called requesting RF for Latisse. Okay to send in?

## 2023-09-12 ENCOUNTER — Other Ambulatory Visit: Payer: Self-pay

## 2023-09-12 DIAGNOSIS — N951 Menopausal and female climacteric states: Secondary | ICD-10-CM

## 2023-09-12 NOTE — Telephone Encounter (Signed)
Med refill request: Estrace Last AEX: 07/29/23 Next AEX: not scheduled Last MMG (if hormonal med) 01/24/23 Refill authorized: Please Advise, #90

## 2023-09-12 NOTE — Telephone Encounter (Signed)
Tapering to discontinue

## 2023-09-13 ENCOUNTER — Other Ambulatory Visit: Payer: Self-pay | Admitting: Obstetrics and Gynecology

## 2023-09-13 DIAGNOSIS — N951 Menopausal and female climacteric states: Secondary | ICD-10-CM

## 2023-09-13 NOTE — Telephone Encounter (Signed)
Med refill request:estradiol  Last AEX: 07/29/2023 Next AEX: not yet scheduled Last MMG (if hormonal med) 01/24/2023 Refill authorized: Last Rx sent #90 with zero refills on 07/29/2023. Please approve or deny as appropriate.

## 2023-09-16 NOTE — Telephone Encounter (Signed)
Tapered off medication

## 2023-11-06 ENCOUNTER — Other Ambulatory Visit: Payer: Self-pay | Admitting: Obstetrics & Gynecology

## 2023-11-06 DIAGNOSIS — N951 Menopausal and female climacteric states: Secondary | ICD-10-CM

## 2023-11-06 NOTE — Telephone Encounter (Signed)
 Med refill request: progesterone 100 mg #90 Last AEX: 07/29/23 Dr. Kennith Center Next AEX: none scheduled Last MMG (if hormonal med) Last filled 08/05/23, with 1 refill  [facility] Refill authorized: progesterone 100 mg #90, zero refills.  Please approve or deny as appropriate.

## 2023-11-06 NOTE — Telephone Encounter (Signed)
 Therapy completed

## 2023-11-08 ENCOUNTER — Other Ambulatory Visit: Payer: Self-pay | Admitting: Otolaryngology

## 2023-11-08 DIAGNOSIS — J3489 Other specified disorders of nose and nasal sinuses: Secondary | ICD-10-CM | POA: Diagnosis not present

## 2023-11-08 DIAGNOSIS — E041 Nontoxic single thyroid nodule: Secondary | ICD-10-CM

## 2023-11-08 DIAGNOSIS — J343 Hypertrophy of nasal turbinates: Secondary | ICD-10-CM | POA: Diagnosis not present

## 2023-11-08 DIAGNOSIS — R0683 Snoring: Secondary | ICD-10-CM | POA: Diagnosis not present

## 2023-11-11 ENCOUNTER — Encounter: Payer: Self-pay | Admitting: Otolaryngology

## 2023-11-12 ENCOUNTER — Ambulatory Visit
Admission: RE | Admit: 2023-11-12 | Discharge: 2023-11-12 | Disposition: A | Discharge status: Home / Self Care | Source: Ambulatory Visit | Attending: Otolaryngology | Admitting: Otolaryngology

## 2023-11-12 ENCOUNTER — Other Ambulatory Visit: Payer: Self-pay | Admitting: Otolaryngology

## 2023-11-12 DIAGNOSIS — E041 Nontoxic single thyroid nodule: Secondary | ICD-10-CM | POA: Diagnosis not present

## 2023-11-13 ENCOUNTER — Encounter: Payer: Self-pay | Admitting: Otolaryngology

## 2023-11-19 ENCOUNTER — Encounter: Payer: Self-pay | Admitting: Dermatology

## 2023-11-19 ENCOUNTER — Ambulatory Visit: Payer: BC Managed Care – PPO | Admitting: Dermatology

## 2023-11-19 DIAGNOSIS — L729 Follicular cyst of the skin and subcutaneous tissue, unspecified: Secondary | ICD-10-CM | POA: Diagnosis not present

## 2023-11-19 DIAGNOSIS — L988 Other specified disorders of the skin and subcutaneous tissue: Secondary | ICD-10-CM

## 2023-11-19 DIAGNOSIS — J301 Allergic rhinitis due to pollen: Secondary | ICD-10-CM | POA: Diagnosis not present

## 2023-11-19 DIAGNOSIS — Z7189 Other specified counseling: Secondary | ICD-10-CM | POA: Diagnosis not present

## 2023-11-19 DIAGNOSIS — Z79899 Other long term (current) drug therapy: Secondary | ICD-10-CM

## 2023-11-19 DIAGNOSIS — L03313 Cellulitis of chest wall: Secondary | ICD-10-CM | POA: Diagnosis not present

## 2023-11-19 DIAGNOSIS — T7840XA Allergy, unspecified, initial encounter: Secondary | ICD-10-CM | POA: Diagnosis not present

## 2023-11-19 MED ORDER — DOXYCYCLINE MONOHYDRATE 100 MG PO CAPS: 100.0000 mg | ORAL_CAPSULE | Freq: Two times a day (BID) | ORAL | 0 refills | Status: DC

## 2023-11-19 NOTE — Patient Instructions (Addendum)
Doxycycline should be taken with food to prevent nausea. Do not lay down for 30 minutes after taking. Be cautious with sun exposure and use good sun protection while on this medication. Pregnant women should not take this medication.   Due to recent changes in healthcare laws, you may see results of your pathology and/or laboratory studies on MyChart before the doctors have had a chance to review them. We understand that in some cases there may be results that are confusing or concerning to you. Please understand that not all results are received at the same time and often the doctors may need to interpret multiple results in order to provide you with the best plan of care or course of treatment. Therefore, we ask that you please give [place] 2 business days to thoroughly review all your results before contacting the office for clarification. Should we see a critical lab result, you will be contacted sooner.   If You Need Anything After Your Visit  If you have any questions or concerns for your doctor, please call our main line at [phone] and press option 4 to reach your doctor's medical assistant. If no one answers, please leave a voicemail as directed and we will return your call as soon as possible. Messages left after 4 pm will be answered the following business day.   You may also send [place] a message via MyChart. We typically respond to MyChart messages within 1-2 business days.  For prescription refills, please ask your pharmacy to contact our office. Our fax number is [phone].  If you have an urgent issue when the clinic is closed that cannot wait until the next business day, you can page your doctor at the number below.    Please note that while we do our best to be available for urgent issues outside of office hours, we are not available 24/7.   If you have an urgent issue and are unable to reach [place], you may choose to seek medical care at your doctor's office, retail clinic, urgent care  center, or emergency room.  If you have a medical emergency, please immediately call 911 or go to the emergency department.  Pager Numbers  - Dr. Gwen Pounds: [phone]  - Dr. Roseanne Reno: [phone]  - Dr. Katrinka Blazing: [phone]   In the event of inclement weather, please call our main line at [phone] for an update on the status of any delays or closures.  Dermatology Medication Tips: Please keep the boxes that topical medications come in in order to help keep track of the instructions about where and how to use these. Pharmacies typically print the medication instructions only on the boxes and not directly on the medication tubes.   If your medication is too expensive, please contact our office at [phone] option 4 or send [place] a message through MyChart.   We are unable to tell what your co-pay for medications will be in advance as this is different depending on your insurance coverage. However, we may be able to find a substitute medication at lower cost or fill out paperwork to get insurance to cover a needed medication.   If a prior authorization is required to get your medication covered by your insurance company, please allow [place] 1-2 business days to complete this process.  Drug prices often vary depending on where the prescription is filled and some pharmacies may offer cheaper prices.  The website www.goodrx.com contains coupons for medications through different pharmacies. The prices here do not account for what  the cost may be with help from insurance (it may be cheaper with your insurance), but the website can give you the price if you did not use any insurance.  - You can print the associated coupon and take it with your prescription to the pharmacy.  - You may also stop by our office during regular business hours and pick up a GoodRx coupon card.  - If you need your prescription sent electronically to a different pharmacy, notify our office through [facility] or by  phone at [phone] option 4.     Si Usted Necesita Algo Despus de Su Visita  Tambin puede enviarnos un mensaje a travs de Clinical cytogeneticist. Por lo general respondemos a los mensajes de MyChart en el transcurso de 1 a 2 das hbiles.  Para renovar recetas, por favor pida a su farmacia que se ponga en contacto con nuestra oficina. Annie Sable de fax es [place] [phone].  Si tiene un asunto urgente cuando la clnica est cerrada y que no puede esperar hasta el siguiente da hbil, puede llamar/localizar a su doctor(a) al nmero que aparece a continuacin.   Por favor, tenga en cuenta que aunque hacemos todo lo posible para estar disponibles para asuntos urgentes fuera del horario de [place], no estamos disponibles las 24 horas del da, los 7 [address]  [address] de la [place].   Si tiene un problema urgente y no puede comunicarse con nosotros, puede optar por buscar atencin mdica  en el consultorio de su doctor(a), en una clnica privada, en un centro de atencin urgente o en una sala de emergencias.  Si tiene Engineer, drilling, por favor llame inmediatamente al 911 o vaya a la sala de emergencias.  Nmeros de bper  - Dr. Gwen Pounds: [phone]  - Dra. Roseanne Reno: [phone]  - Dr. Katrinka Blazing: [phone]   En caso de inclemencias del tiempo, por favor llame a Lacy Duverney principal al [phone] para una actualizacin sobre el [place] de cualquier retraso o cierre.  Consejos para la medicacin en dermatologa: Por favor, guarde las cajas en las que vienen los medicamentos de uso tpico para ayudarle a seguir las instrucciones sobre dnde y cmo usarlos. Las farmacias generalmente imprimen las instrucciones del medicamento slo en las cajas y no directamente en los tubos del [place].   Si su medicamento es muy caro, por favor, pngase en contacto con Rolm Gala llamando al [phone] y presione la opcin 4 o envenos un mensaje a travs de Clinical cytogeneticist.   No podemos decirle cul ser su copago  por los medicamentos por adelantado ya que esto es diferente dependiendo de la cobertura de su seguro. Sin embargo, es posible que podamos encontrar un medicamento sustituto a Audiological scientist un formulario para que el seguro cubra el medicamento que se considera necesario.   Si se requiere una autorizacin previa para que su compaa de seguros [place] su medicamento, por favor permtanos de 1 a 2 das hbiles para completar [address]  Los precios de los medicamentos varan con frecuencia dependiendo del Environmental consultant de dnde se surte la receta y alguna farmacias pueden ofrecer precios ms baratos.  El sitio web www.goodrx.com tiene cupones para medicamentos de Health and safety inspector. Los precios aqu no tienen en cuenta lo que podra costar con la ayuda del seguro (puede ser ms barato con su seguro), pero el sitio web puede darle el precio si no utiliz Tourist information centre manager.  - Puede imprimir el cupn correspondiente y llevarlo con su receta a la farmacia.  - Tambin puede  pasar por nuestra oficina durante el horario de atencin regular y Education officer, museum una tarjeta de cupones de GoodRx.  - Si necesita que su receta se enve electrnicamente a una farmacia diferente, informe a nuestra oficina a travs de MyChart de [place] o por telfono llamando al [phone] y presione la opcin 4.

## 2023-11-19 NOTE — Progress Notes (Signed)
   Follow-Up Visit   Subjective  Tina Allen is a 58 y.o. female who presents for the following: Botox for facial elastosis  The patient also complains of a knot on her chest that she recently squeezed and it became red sore and swollen.  She would like evaluation and treatment.  The following portions of the chart were reviewed this encounter and updated as appropriate: medications, allergies, medical history  Review of Systems:  No other skin or systemic complaints except as noted in HPI or Assessment and Plan.  Objective  Well appearing patient in no apparent distress; mood and affect are within normal limits.  A focused examination was performed of the face.  Relevant physical exam findings are noted in the Assessment and Plan.   Injection map photo    Assessment & Plan  CELLULITIS OF CHEST WALL   CYST OF SKIN   COUNSELING AND COORDINATION OF CARE   MEDICATION MANAGEMENT   ELASTOSIS OF SKIN   Related Medications tretinoin (RETIN-A) 0.025 % cream Apply pea sized amount to face qhs Facial Elastosis  Location: See attached image  Informed consent: Discussed risks (infection, pain, bleeding, bruising, swelling, allergic reaction, paralysis of nearby muscles, eyelid droop, double vision, neck weakness, difficulty breathing, headache, undesirable cosmetic result, and need for additional treatment) and benefits of the procedure, as well as the alternatives.  Informed consent was obtained.  Preparation: The area was cleansed with alcohol.  Procedure Details:  Botox was injected into the dermis with a 30-gauge needle. Pressure applied to any bleeding. Ice packs offered for swelling.  Lot Number:  [id number] Expiration:  09/2025  Total Units Injected:  45  Plan: Tylenol may be used for headache.  Allow 2 weeks before returning to clinic for additional dosing as needed. Patient will call for any problems.   CYST with rupture and inflammation and cellulitis (no  abscess) Patient had a nodule that she squeezed and it seemed to rupture under the skin.  It has not drained.  It is sore Exam: Subcutaneous nodule with tenderness and erythema at intermammary.  No fluctuance.  The area is firm.  Approximately 3 cm  Hx of ruptured cyst inside at intermammary that is causing inflammation tender, painful and firm papule  Not abscessed today   Treatment plan: Start Doxycycline 100 mg - take 1 po bid with food for 14 days   Doxycycline should be taken with food to prevent nausea. Do not lay down for 30 minutes after taking. Be cautious with sun exposure and use good sun protection while on this medication. Pregnant women should not take this medication.   May need additional treatment needed at this time.  Advised patient this may abscess and require incision and drainage.  It may also drain on its own.  It otherwise may just drink up and calm down on its own with antibiotic treatment as prescribed.  In the future if any cyst is evidence remaining, may want to highly consider excision before this occurs again.  Return for 3 - 4 month botox .  IAsher Muir, CMA, am acting as scribe for Armida Sans, MD.   Documentation: I have reviewed the above documentation for accuracy and completeness, and I agree with the above.  Armida Sans, MD

## 2023-11-21 ENCOUNTER — Ambulatory Visit: Admitting: Dermatology

## 2023-11-21 NOTE — Discharge Instructions (Signed)

## 2023-11-26 ENCOUNTER — Other Ambulatory Visit: Payer: Self-pay | Admitting: Obstetrics & Gynecology

## 2023-11-26 DIAGNOSIS — N951 Menopausal and female climacteric states: Secondary | ICD-10-CM

## 2023-11-26 NOTE — Anesthesia Preprocedure Evaluation (Signed)
 Anesthesia Evaluation  Patient identified by MRN, date of birth, ID band Patient awake    Reviewed: Allergy & Precautions, H&P , NPO status , Patient's Chart, lab work & pertinent test results  Airway Mallampati: II  TM Distance: >3 FB Neck ROM: Full    Dental no notable dental hx. (+) Caps States no caps nor crowns on upper front teeth nor lower front teeth:   Pulmonary neg pulmonary ROS   Pulmonary exam normal breath sounds clear to auscultation       Cardiovascular negative cardio ROS Normal cardiovascular exam Rhythm:Regular Rate:Normal     Neuro/Psych  PSYCHIATRIC DISORDERS Anxiety Depression    negative neurological ROS  negative psych ROS   GI/Hepatic negative GI ROS, Neg liver ROS,,,  Endo/Other  negative endocrine ROS    Renal/GU negative Renal ROS  negative genitourinary   Musculoskeletal negative musculoskeletal ROS (+) Arthritis ,    Abdominal   Peds negative pediatric ROS (+)  Hematology negative hematology ROS (+)   Anesthesia Other Findings Wears contact lens Hot flashes due to menopause  Reproductive/Obstetrics negative OB ROS                             Anesthesia Physical Anesthesia Plan  ASA: 1  Anesthesia Plan: MAC   Post-op Pain Management:    Induction: Intravenous  PONV Risk Score and Plan:   Airway Management Planned: Natural Airway and Nasal Cannula  Additional Equipment:   Intra-op Plan:   Post-operative Plan:   Informed Consent: I have reviewed the patients History and Physical, chart, labs and discussed the procedure including the risks, benefits and alternatives for the proposed anesthesia with the patient or authorized representative who has indicated his/her understanding and acceptance.     Dental Advisory Given  Plan Discussed with: Anesthesiologist, CRNA and Surgeon  Anesthesia Plan Comments: (Patient consented for risks of anesthesia  including but not limited to:  - adverse reactions to medications - damage to eyes, teeth, lips or other oral mucosa - nerve damage due to positioning  - sore throat or hoarseness - Damage to heart, brain, nerves, lungs, other parts of body or loss of life  Patient voiced understanding and assent.)       Anesthesia Quick Evaluation

## 2023-11-27 ENCOUNTER — Encounter
Admission: RE | Disposition: A | Discharge status: Home / Self Care | Payer: Self-pay | Source: Home / Self Care | Attending: Otolaryngology

## 2023-11-27 ENCOUNTER — Ambulatory Visit: Payer: Self-pay | Admitting: Anesthesiology

## 2023-11-27 ENCOUNTER — Ambulatory Visit
Admission: RE | Admit: 2023-11-27 | Discharge: 2023-11-27 | Disposition: A | Discharge status: Home / Self Care | Attending: Otolaryngology | Admitting: Otolaryngology

## 2023-11-27 ENCOUNTER — Other Ambulatory Visit: Payer: Self-pay

## 2023-11-27 ENCOUNTER — Encounter: Payer: Self-pay | Admitting: Otolaryngology

## 2023-11-27 DIAGNOSIS — F418 Other specified anxiety disorders: Secondary | ICD-10-CM | POA: Diagnosis not present

## 2023-11-27 DIAGNOSIS — J343 Hypertrophy of nasal turbinates: Secondary | ICD-10-CM | POA: Insufficient documentation

## 2023-11-27 DIAGNOSIS — J3489 Other specified disorders of nose and nasal sinuses: Secondary | ICD-10-CM | POA: Diagnosis not present

## 2023-11-27 HISTORY — PX: ENDOSCOPIC CONCHA BULLOSA RESECTION: SHX6395

## 2023-11-27 HISTORY — PX: TURBINATE REDUCTION: SHX6157

## 2023-11-27 HISTORY — DX: Presence of spectacles and contact lenses: Z97.3

## 2023-11-27 HISTORY — PX: IMAGE GUIDED SINUS SURGERY: SHX6570

## 2023-11-27 SURGERY — SINUS SURGERY, WITH IMAGING GUIDANCE
Anesthesia: Monitor Anesthesia Care | Laterality: Bilateral

## 2023-11-27 MED ORDER — LACTATED RINGERS IV SOLN: INTRAVENOUS | Status: DC

## 2023-11-27 MED ORDER — OXYCODONE HCL 5 MG PO TABS
10.0000 mg | ORAL_TABLET | Freq: Once | ORAL | Status: AC
  Administered 2023-11-27: 10 mg via ORAL

## 2023-11-27 MED ORDER — ACETAMINOPHEN 10 MG/ML IV SOLN
1000.0000 mg | Freq: Once | INTRAVENOUS | Status: AC
  Administered 2023-11-27: 1000 mg via INTRAVENOUS

## 2023-11-27 MED ORDER — ACETAMINOPHEN 10 MG/ML IV SOLN
INTRAVENOUS | Status: AC
  Filled 2023-11-27: qty 100

## 2023-11-27 MED ORDER — OXYMETAZOLINE HCL 0.05 % NA SOLN
NASAL | Status: DC | PRN
  Administered 2023-11-27: 1 via TOPICAL

## 2023-11-27 MED ORDER — PROPOFOL 10 MG/ML IV BOLUS
INTRAVENOUS | Status: DC | PRN
  Administered 2023-11-27: 150 mg via INTRAVENOUS

## 2023-11-27 MED ORDER — FENTANYL CITRATE (PF) 100 MCG/2ML IJ SOLN
INTRAMUSCULAR | Status: AC
  Filled 2023-11-27: qty 2

## 2023-11-27 MED ORDER — HEMOSTATIC AGENTS (NO CHARGE) OPTIME
TOPICAL | Status: DC | PRN
Start: 2023-11-27 — End: 2023-11-27
  Administered 2023-11-27 (×2): 1 via TOPICAL

## 2023-11-27 MED ORDER — LIDOCAINE-EPINEPHRINE 1 %-1:100000 IJ SOLN
INTRAMUSCULAR | Status: DC | PRN
  Administered 2023-11-27: 8 mL

## 2023-11-27 MED ORDER — DEXAMETHASONE SODIUM PHOSPHATE 4 MG/ML IJ SOLN
INTRAMUSCULAR | Status: DC | PRN
Start: 2023-11-27 — End: 2023-11-27
  Administered 2023-11-27: 8 mg via INTRAVENOUS

## 2023-11-27 MED ORDER — LIDOCAINE HCL (CARDIAC) PF 100 MG/5ML IV SOSY
PREFILLED_SYRINGE | INTRAVENOUS | Status: DC | PRN
  Administered 2023-11-27: 40 mg via INTRAVENOUS

## 2023-11-27 MED ORDER — EPHEDRINE SULFATE (PRESSORS) 50 MG/ML IJ SOLN
INTRAMUSCULAR | Status: DC | PRN
  Administered 2023-11-27: 10 mg via INTRAVENOUS

## 2023-11-27 MED ORDER — ACETAMINOPHEN 10 MG/ML IV SOLN: 1000.0000 mg | Freq: Once | INTRAVENOUS | Status: DC

## 2023-11-27 MED ORDER — SUCCINYLCHOLINE CHLORIDE 200 MG/10ML IV SOSY
PREFILLED_SYRINGE | INTRAVENOUS | Status: DC | PRN
  Administered 2023-11-27: 140 mg via INTRAVENOUS

## 2023-11-27 MED ORDER — ONDANSETRON HCL 4 MG PO TABS: 4.0000 mg | ORAL_TABLET | Freq: Three times a day (TID) | ORAL | 0 refills | Status: AC | PRN

## 2023-11-27 MED ORDER — OXYCODONE HCL 5 MG PO TABS
ORAL_TABLET | ORAL | Status: AC
  Filled 2023-11-27: qty 2

## 2023-11-27 MED ORDER — DOXYCYCLINE HYCLATE 100 MG PO CAPS: 100.0000 mg | ORAL_CAPSULE | Freq: Two times a day (BID) | ORAL | 0 refills | Status: AC

## 2023-11-27 MED ORDER — MIDAZOLAM HCL 5 MG/5ML IJ SOLN
INTRAMUSCULAR | Status: DC | PRN
  Administered 2023-11-27: 2 mg via INTRAVENOUS

## 2023-11-27 MED ORDER — FENTANYL CITRATE (PF) 100 MCG/2ML IJ SOLN
INTRAMUSCULAR | Status: DC | PRN
  Administered 2023-11-27 (×2): 50 ug via INTRAVENOUS

## 2023-11-27 MED ORDER — ONDANSETRON HCL 4 MG/2ML IJ SOLN
INTRAMUSCULAR | Status: DC | PRN
  Administered 2023-11-27: 4 mg via INTRAVENOUS

## 2023-11-27 MED ORDER — PHENYLEPHRINE HCL (PRESSORS) 10 MG/ML IV SOLN
INTRAVENOUS | Status: DC | PRN
  Administered 2023-11-27 (×2): 80 ug via INTRAVENOUS

## 2023-11-27 MED ORDER — SODIUM CHLORIDE 0.9 % IV SOLN: INTRAVENOUS | Status: DC | PRN

## 2023-11-27 MED ORDER — MIDAZOLAM HCL 2 MG/2ML IJ SOLN
INTRAMUSCULAR | Status: AC
  Filled 2023-11-27: qty 2

## 2023-11-27 MED ORDER — GLYCOPYRROLATE 0.2 MG/ML IJ SOLN
INTRAMUSCULAR | Status: DC | PRN
  Administered 2023-11-27: .2 mg via INTRAVENOUS

## 2023-11-27 MED ORDER — OXYCODONE-ACETAMINOPHEN 5-325 MG PO TABS: 1.0000 | ORAL_TABLET | ORAL | 0 refills | Status: AC | PRN

## 2023-11-27 SURGICAL SUPPLY — 26 items
ANTIFOG SOL W/FOAM PAD STRL (MISCELLANEOUS) ×1 IMPLANT
BLADE SHAVER TRUDI 4 15 DEG (ENT DISPOSABLE) IMPLANT
CABLE TRUDI DISPOSABLE (ENT DISPOSABLE) ×2 IMPLANT
CANISTER SUCT 1200ML W/VALVE (MISCELLANEOUS) ×1 IMPLANT
COAGULATOR SUCTION 6 10FR HC (MISCELLANEOUS) ×1 IMPLANT
DEVICE INFLATION SEID (MISCELLANEOUS) IMPLANT
DRESSING NASL FOAM PST OP SINU (MISCELLANEOUS) IMPLANT
DRSG NASAL FOAM POST OP SINU (MISCELLANEOUS) ×1 IMPLANT
ELECT REM PT RETURN 9FT ADLT (ELECTROSURGICAL) ×1 IMPLANT
ELECTRODE REM PT RTRN 9FT ADLT (ELECTROSURGICAL) ×1 IMPLANT
GLOVE SURG GAMMEX PI TX LF 7.5 (GLOVE) ×2 IMPLANT
GOWN STRL REUS W/ TWL LRG LVL3 (GOWN DISPOSABLE) ×1 IMPLANT
KIT TURNOVER KIT A (KITS) ×1 IMPLANT
NS IRRIG 500ML POUR BTL (IV SOLUTION) ×1 IMPLANT
PACK ENT CUSTOM (PACKS) ×1 IMPLANT
PACKING NASAL EPIS 4X2.4 XEROG (MISCELLANEOUS) IMPLANT
PATTIES SURGICAL .5 X3 (DISPOSABLE) ×1 IMPLANT
SOLUTION ANTFG W/FOAM PAD STRL (MISCELLANEOUS) ×1 IMPLANT
STRAP BODY AND KNEE 60X3 (MISCELLANEOUS) ×1 IMPLANT
SYR 10ML LL (SYRINGE) ×1 IMPLANT
SYR EAR/ULCER 2OZ (SYRINGE) ×1 IMPLANT
TRACKER DISPOSABLE PAITIENT (MISCELLANEOUS) ×1 IMPLANT
TRACKER TRUDI DISPOSABLE (ENT DISPOSABLE) IMPLANT
TUBING CONNECTING 10 (TUBING) ×1 IMPLANT
TUBING IRRIGATION BIEN-AIR (TUBING) ×1 IMPLANT
WATER STERILE IRR 500ML POUR (IV SOLUTION) IMPLANT

## 2023-11-27 NOTE — Anesthesia Postprocedure Evaluation (Signed)
 Anesthesia Post Note  Patient: Tina Allen  Procedure(s) Performed: SINUS SURGERY, WITH IMAGING GUIDANCE (Bilateral) EXCISION, CONCHA BULLOSA, ENDOSCOPIC (Bilateral) REDUCTION, NASAL TURBINATE (Bilateral)  Patient location during evaluation: PACU Anesthesia Type: MAC Level of consciousness: awake and alert Pain management: pain level controlled Vital Signs Assessment: post-procedure vital signs reviewed and stable Respiratory status: spontaneous breathing, nonlabored ventilation, respiratory function stable and patient connected to nasal cannula oxygen Cardiovascular status: blood pressure returned to baseline and stable Postop Assessment: no apparent nausea or vomiting Anesthetic complications: no   No notable events documented.   Last Vitals:  Vitals:   11/27/23 1200 11/27/23 1215  BP: 139/88 (!) 139/90  Pulse: 77 74  Resp: 20 14  Temp:  (!) 36.1 C  SpO2: 96% 98%    Last Pain:  Vitals:   11/27/23 1215  TempSrc:   PainSc: 5                  Tina Allen

## 2023-11-27 NOTE — Op Note (Signed)
..  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  Procedure: With the patient in a comfortable supine position,  general orotracheal anesthesia was induced without difficulty.  The patient received preoperative Afrin spray for topical decongestion and vasoconstriction.  At an appropriate level, the patient was placed in a semi-sitting position.  Nasal vibrissae were trimmed.   1% Xylocaine with 1:100,000 epinephrine, 8 cc's, was infiltrated into the anterior floor of the nose, into the nasal spine region, into the membranous columella, and finally into the submucoperichondrial plane of the septum on both sides.  Several minutes were allowed for this to take effect.  Cottoniod pledgetts soaked in Afrin were placed into both nasal cavities and left while the patient was prepped and draped in the standard fashion.   A proper time-out was performed.  The Acclarent image guidance system was set up in the normal fashion.  The materials were removed from the nose and observed to be intact and correct in number.  The nose was inspected with a headlight and zero degree endoscope with the findings as described above.  The inferior turbinates were then inspected.  Under endoscopic visualization, the inferior turbinates were infractured bilaterally with a Therapist, nutritional.  A kelly clamp was attached to the anterior-inferior third of each inferior turbinate for approximately one minute.  Under endoscopic visualization, Tru-cutting forceps were  used to remove the anterior-inferior third of each inferior turbinate.  Electrocautery was used to control bleeding in the area. The remaining turbinate was then outfractured to open up the airway further. There was no significant bleeding noted. The right turbinate was then trimmed and outfractured in a similar fashion.  At this time, the middle turbinates were inspected.  A Freer elevator was used to make a verticle incision in the anterior portion of the concha bullosa on the patient's right side.  Using a Grimwald forceps, the lateral wall of the concha bullosa was next reduced and resected opening the superior airway.  This was repeated on the patient's left side in a similar fashion.  Hemostasis was achieved with bovie suction cautery.  The airways were then visualized and showed open passageways on both sides that were significantly improved compared to before surgery.        Stamberger sinufoam was placed along the cut edge of the inferior and middle turbinates bilaterally.  Xerogel was placed lateral to the middle turbinates bilaterally.  The patient was turned back over to anesthesia, and awakened, extubated, and taken to the PACU in satisfactory condition.  Dispo:   PACU to home  Plan: Ice, elevation, narcotic analgesia,  and prophylactic antibiotics.  We will reevaluate the patient in the office in 7 days and remove the septal splints.  Return to work in 7 days, strenuous activities in two weeks.   [name] 11/27/2023 11:16 AM

## 2023-11-27 NOTE — Telephone Encounter (Signed)
Discontinued therapy

## 2023-11-27 NOTE — H&P (Signed)
 ..  History and Physical paper copy reviewed and updated date of procedure and will be scanned into system.  Patient seen and examined.

## 2023-11-27 NOTE — Telephone Encounter (Signed)
 Med refill request: Progesterone 100 mg Last AEX: 07/29/2023 GH Next AEX: not yet scheduled Last MMG (if hormonal med) 01/24/2023 Refill authorized: Last Rx sent #90 with one refill on 07/29/2023. Please approve or deny as appropriate.

## 2023-11-27 NOTE — Anesthesia Procedure Notes (Signed)
 Procedure Name: Intubation Date/Time: 11/27/2023 10:38 AM  Performed by: Andee Poles, CRNAPre-anesthesia Checklist: Patient identified, Emergency Drugs available, Suction available, Patient being monitored and Timeout performed Patient Re-evaluated:Patient Re-evaluated prior to induction Oxygen Delivery Method: Circle system utilized Preoxygenation: Pre-oxygenation with 100% oxygen Induction Type: IV induction Ventilation: Mask ventilation without difficulty Laryngoscope Size: Mac and 3 Grade View: Grade I Tube type: Oral Rae Tube size: 7.0 mm Number of attempts: 1 Airway Equipment and Method: LTA kit utilized Placement Confirmation: ETT inserted through vocal cords under direct vision, positive ETCO2 and breath sounds checked- equal and bilateral Tube secured with: Tape Dental Injury: Teeth and Oropharynx as per pre-operative assessment

## 2023-11-27 NOTE — Transfer of Care (Signed)
 Immediate Anesthesia Transfer of Care Note  Patient: Tina Allen  Procedure(s) Performed: SINUS SURGERY, WITH IMAGING GUIDANCE (Bilateral) EXCISION, CONCHA BULLOSA, ENDOSCOPIC (Bilateral) REDUCTION, NASAL TURBINATE (Bilateral)  Patient Location: PACU  Anesthesia Type: MAC  Level of Consciousness: awake, alert  and patient cooperative  Airway and Oxygen Therapy: Patient Spontanous Breathing and Patient connected to supplemental oxygen  Post-op Assessment: Post-op Vital signs reviewed, Patient's Cardiovascular Status Stable, Respiratory Function Stable, Patent Airway and No signs of Nausea or vomiting  Post-op Vital Signs: Reviewed and stable  Complications: No notable events documented.

## 2023-11-28 ENCOUNTER — Encounter: Payer: Self-pay | Admitting: Otolaryngology

## 2023-11-28 ENCOUNTER — Other Ambulatory Visit: Payer: Self-pay | Admitting: Obstetrics and Gynecology

## 2023-11-28 DIAGNOSIS — N951 Menopausal and female climacteric states: Secondary | ICD-10-CM

## 2023-11-29 ENCOUNTER — Other Ambulatory Visit: Payer: Self-pay | Admitting: Obstetrics and Gynecology

## 2023-11-29 DIAGNOSIS — N951 Menopausal and female climacteric states: Secondary | ICD-10-CM

## 2023-11-29 MED ORDER — ESTRADIOL 0.5 MG PO TABS: ORAL_TABLET | ORAL | 2 refills | Status: DC

## 2023-11-29 MED ORDER — PROGESTERONE MICRONIZED 100 MG PO CAPS: 100.0000 mg | ORAL_CAPSULE | Freq: Every day | ORAL | 2 refills | Status: DC

## 2023-11-29 NOTE — Telephone Encounter (Signed)
 Med refill request: Estradiol tablet  Last AEX: 07/29/23 Next AEX: not scheduled  Last MMG (if hormonal med)01/24/23 birads cat 1 neg  Refill authorized: Last rx 07/29/23 #90 with 0 refills. Patient has also left a comment stating "Also need progesterone". Please approve or deny

## 2023-12-05 DIAGNOSIS — G4733 Obstructive sleep apnea (adult) (pediatric): Secondary | ICD-10-CM | POA: Diagnosis not present

## 2023-12-05 DIAGNOSIS — G4730 Sleep apnea, unspecified: Secondary | ICD-10-CM | POA: Diagnosis not present

## 2023-12-06 ENCOUNTER — Encounter: Payer: Self-pay | Admitting: Obstetrics and Gynecology

## 2023-12-11 ENCOUNTER — Other Ambulatory Visit: Payer: Self-pay

## 2023-12-11 DIAGNOSIS — N951 Menopausal and female climacteric states: Secondary | ICD-10-CM

## 2023-12-11 MED ORDER — ESTRADIOL 0.5 MG PO TABS: ORAL_TABLET | ORAL | 2 refills | Status: DC

## 2023-12-11 NOTE — Telephone Encounter (Signed)
 Medication refill request: estradiol tablet 0.5mg  Last AEX:  07-29-23 Next AEX: not scheduled Last MMG (if hormonal medication request): 01-10-23 bilateral & left breast 01-24-23 birads 1:neg Refill authorized: at [facility] on 07-29-23, plan was for patient to wean down on the way she was taking her estradiol tablet. She stated as soon as she started taking 1/2 tablet daily that she began having horrible night sweats & rage. Patient asking if she can go back to taking 1 tab daily. Please approve if appropriate

## 2023-12-12 ENCOUNTER — Other Ambulatory Visit: Payer: Self-pay | Admitting: Obstetrics and Gynecology

## 2023-12-12 DIAGNOSIS — N951 Menopausal and female climacteric states: Secondary | ICD-10-CM

## 2023-12-12 NOTE — Telephone Encounter (Signed)
Patient aware rx was sent to the pharmacy

## 2023-12-12 NOTE — Telephone Encounter (Addendum)
 Med refill request: estradiol 0.5 mg tablet Duplicate request.  Already taken care of.

## 2023-12-12 NOTE — Telephone Encounter (Signed)
 Rx refused, duplicate request, rx just sent in 12/13/23 by Dr.Hines. Routing to provider to review

## 2023-12-18 ENCOUNTER — Ambulatory Visit: Payer: Self-pay | Admitting: Dermatology

## 2023-12-18 ENCOUNTER — Encounter: Payer: Self-pay | Admitting: Dermatology

## 2023-12-18 DIAGNOSIS — L905 Scar conditions and fibrosis of skin: Secondary | ICD-10-CM

## 2023-12-18 NOTE — Patient Instructions (Signed)
 Recommend  Valaria Garland, M.D. [address], [address] [place], [place] [address] [phone]  [email or web address]  Recommend: Bufford Carne, MD [facility]  [address] [address]  [address], [address]  [place], [place] [address] [phone] [email or web address]

## 2023-12-18 NOTE — Progress Notes (Signed)
 No charge. Patient was not seen today. No filler procedure performed. Pt is concerned about acne scarring. Referred pt to Dr. Crissie Dome Cox or Dr. Chris Adigun for laser resurfacing of acne scars on cheeks.  Documentation: I have reviewed the above documentation for accuracy and completeness, and I agree with the above.  Artemio Larry MD

## 2023-12-18 NOTE — Progress Notes (Deleted)
   Follow-Up Visit   Subjective  Tina Allen is a 58 y.o. female who presents for the following: filler for facial elastosis  The following portions of the chart were reviewed this encounter and updated as appropriate: medications, allergies, medical history  Review of Systems:  No other skin or systemic complaints except as noted in HPI or Assessment and Plan.  Objective  Well appearing patient in no apparent distress; mood and affect are within normal limits.  A focused examination was performed of the face. Relevant physical exam findings are noted in the Assessment and Plan or shown in photos.  Before photos*** Injection map photo***    Assessment & Plan    Facial Elastosis  Prior to the procedure, the patient's past medical history, allergies and the rare but potential risks and complications were reviewed with the patient and a signed consent was obtained. Pre and post-treatment care was discussed and instructions provided.   Location: {LOCATION; FILLING MATERIAL WUX:32440}  Filler Type: Restylane refyne  Procedure: The area was prepped thoroughly with Puracyn. After introducing the needle into the desired treatment area, the syringe plunger was drawn back to ensure there was no flash of blood prior to injecting the filler in order to minimize risk of intravascular injection and vascular occlusion. After injection of the filler, the treated areas were cleansed and iced to reduce swelling. Post-treatment instructions were reviewed with the patient.       Patient tolerated the procedure well. The patient will call with any problems, questions or concerns prior to their next appointment.   No follow-ups on file.  I, Alese Furniss, CMA, am acting as scribe for Artemio Larry, MD.   Documentation: I have reviewed the above documentation for accuracy and completeness, and I agree with the above.  Artemio Larry, MD

## 2024-01-15 DIAGNOSIS — J3489 Other specified disorders of nose and nasal sinuses: Secondary | ICD-10-CM | POA: Diagnosis not present

## 2024-01-15 DIAGNOSIS — J301 Allergic rhinitis due to pollen: Secondary | ICD-10-CM | POA: Diagnosis not present

## 2024-02-17 DIAGNOSIS — M5451 Vertebrogenic low back pain: Secondary | ICD-10-CM | POA: Diagnosis not present

## 2024-02-17 DIAGNOSIS — M6281 Muscle weakness (generalized): Secondary | ICD-10-CM | POA: Diagnosis not present

## 2024-02-24 ENCOUNTER — Encounter: Payer: Self-pay | Admitting: Family

## 2024-02-24 ENCOUNTER — Ambulatory Visit (INDEPENDENT_AMBULATORY_CARE_PROVIDER_SITE_OTHER): Admitting: Family

## 2024-02-24 VITALS — BP 118/82 | HR 66 | Temp 97.5°F | Ht 65.0 in | Wt 152.0 lb

## 2024-02-24 DIAGNOSIS — E559 Vitamin D deficiency, unspecified: Secondary | ICD-10-CM

## 2024-02-24 DIAGNOSIS — E041 Nontoxic single thyroid nodule: Secondary | ICD-10-CM | POA: Insufficient documentation

## 2024-02-24 DIAGNOSIS — Z23 Encounter for immunization: Secondary | ICD-10-CM

## 2024-02-24 DIAGNOSIS — R600 Localized edema: Secondary | ICD-10-CM | POA: Diagnosis not present

## 2024-02-24 DIAGNOSIS — E782 Mixed hyperlipidemia: Secondary | ICD-10-CM | POA: Diagnosis not present

## 2024-02-24 DIAGNOSIS — Z0000 Encounter for general adult medical examination without abnormal findings: Secondary | ICD-10-CM | POA: Diagnosis not present

## 2024-02-24 DIAGNOSIS — Z1231 Encounter for screening mammogram for malignant neoplasm of breast: Secondary | ICD-10-CM

## 2024-02-24 DIAGNOSIS — E538 Deficiency of other specified B group vitamins: Secondary | ICD-10-CM

## 2024-02-24 LAB — T4, FREE: Free T4: 0.71 ng/dL (ref 0.60–1.60)

## 2024-02-24 LAB — CBC
HCT: 39.6 % (ref 36.0–46.0)
Hemoglobin: 13.4 g/dL (ref 12.0–15.0)
MCHC: 33.8 g/dL (ref 30.0–36.0)
MCV: 97.8 fl (ref 78.0–100.0)
Platelets: 233 10*3/uL (ref 150.0–400.0)
RBC: 4.05 Mil/uL (ref 3.87–5.11)
RDW: 12.7 % (ref 11.5–15.5)
WBC: 5.7 10*3/uL (ref 4.0–10.5)

## 2024-02-24 LAB — BASIC METABOLIC PANEL WITH GFR
BUN: 11 mg/dL (ref 6–23)
CO2: 28 meq/L (ref 19–32)
Calcium: 9 mg/dL (ref 8.4–10.5)
Chloride: 103 meq/L (ref 96–112)
Creatinine, Ser: 0.83 mg/dL (ref 0.40–1.20)
GFR: 76.66 mL/min (ref >60.00)
Glucose, Bld: 89 mg/dL (ref 70–99)
Potassium: 4.3 meq/L (ref 3.5–5.1)
Sodium: 139 meq/L (ref 135–145)

## 2024-02-24 LAB — LIPID PANEL
Cholesterol: 213 mg/dL — ABNORMAL HIGH (ref 0–200)
HDL: 57.9 mg/dL (ref >39.00)
LDL Cholesterol: 123 mg/dL — ABNORMAL HIGH (ref 0–99)
NonHDL: 155.08
Total CHOL/HDL Ratio: 4
Triglycerides: 160 mg/dL — ABNORMAL HIGH (ref 0.0–149.0)
VLDL: 32 mg/dL (ref 0.0–40.0)

## 2024-02-24 LAB — TSH: TSH: 2.22 u[IU]/mL (ref 0.35–5.50)

## 2024-02-24 LAB — T3, FREE: T3, Free: 2.7 pg/mL (ref 2.3–4.2)

## 2024-02-24 MED ORDER — HYDROCHLOROTHIAZIDE 12.5 MG PO CAPS: ORAL_CAPSULE | ORAL | 1 refills | Status: DC

## 2024-02-24 NOTE — Assessment & Plan Note (Signed)
 Trial hydrochlorothiazide 12.5 mg prn  Compression stockings advised, elevate legs, watch salt in diet.

## 2024-02-24 NOTE — Assessment & Plan Note (Signed)
 Ordered vitamin d pending results.

## 2024-02-24 NOTE — Assessment & Plan Note (Signed)
 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  Currently asymptomatic

## 2024-02-24 NOTE — Assessment & Plan Note (Signed)
 Patient Counseling(The following topics were reviewed):  Preventative care handout given to pt  Health maintenance and immunizations reviewed. Please refer to Health maintenance section. Pt advised on safe sex, wearing seatbelts in car, and proper nutrition labwork ordered today for annual Dental health: Discussed importance of regular tooth brushing, flossing, and dental visits. Tdap in office today

## 2024-02-24 NOTE — Assessment & Plan Note (Signed)
 Ordered lipid panel, pending results. Work on low cholesterol diet and exercise as tolerated

## 2024-02-24 NOTE — Progress Notes (Signed)
 Subjective:  Patient ID: Tina Allen, female    DOB: Jun 22, 1966  Age: 58 y.o. MRN: [id number]  Patient Care Team: Corwin Antu, FNP as PCP - General (Family Medicine)   CC:  Chief Complaint  Patient presents with   Edema    B/l foot swelling. Denies any injury at this time     HPI Tina Allen is a 58 y.o. female who presents today for an annual physical exam. She reports consuming a general diet. Exercise limited by intermittent back pain She generally feels well. She reports sleeping well. She does have additional problems to discuss today.   Vision:Within last year Dental:Receives regular dental care  Mammogram: 12/2022  Last pap: 05/03/20  Colonoscopy: 01/15/22 every five years   Pt is with acute concerns.   Bil foot swelling, started years ago and is intermittent.  The other day her feet were so swollen.  Seems to happen when she walks a lot and or on feet often. She does state the night before the pedal edema she had a lot of salty foods the night prior. [place].   No urinary symptoms.  No pain in calf with walking.  No tingling numbness lower ext.   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 Pt without abn weight gain, no thinning hair, no joint pains    Advanced Directives Patient does not have advanced directives  DEPRESSION SCREENING    02/24/2024   10:32 AM 07/29/2023   10:27 AM 07/29/2023   10:21 AM 01/01/2023   10:30 AM 10/27/2021   11:25 AM 05/30/2021   12:13 PM 11/15/2020    3:43 PM  PHQ 2/9 Scores  PHQ - 2 Score 0 0 0 2 0 2 2  PHQ- 9 Score 0 2 2 9 4 8 10      ROS: Negative unless specifically indicated above in HPI.    Current Outpatient Medications:    ALPRAZolam  (XANAX ) 0.25 MG tablet, TAKE 1 TABLET BY MOUTH AT BEDTIME AS NEEDED FOR ANXIETY, Disp: 30 tablet, Rfl: 0   bimatoprost  (LATISSE ) 0.03 % ophthalmic solution, PLACE 1 APPLICATION INTO BOTH EYES  AS DIRECTED. PLACE ONE DROP ON APPLICATOR AND APPLY EVENLY ALONG THE SKIN OF THE UPPER EYELID AT BASE OF EYELASHES ONCE DAILY AT BEDTIME REPEAT PROCEDURE FOR SECOND EYE (USE A CLEAN APPLICATOR)., Disp: 5 mL, Rfl: 0   estradiol  (ESTRACE ) 0.5 MG tablet, Take 1 tablet daily, Disp: 90 tablet, Rfl: 2   hydrochlorothiazide (MICROZIDE) 12.5 MG capsule, Take one po every day prn edema, Disp: 30 capsule, Rfl: 1   hydrocortisone  2.5 % cream, Apply topically 3 (three) times daily as needed., Disp: 28 g, Rfl: 3   ondansetron  (ZOFRAN ) 4 MG tablet, Take 1 tablet (4 mg total) by mouth every 8 (eight) hours as needed for nausea or vomiting., Disp: 20 tablet, Rfl: 0   progesterone  (PROMETRIUM ) 100 MG capsule, Take 1 capsule (100 mg total) by mouth at bedtime., Disp: 90 capsule, Rfl: 2   tretinoin  (RETIN-A ) 0.025 % cream, Apply pea sized amount to face qhs, Disp: 45 g, Rfl: 11    Objective:    BP 118/82   Pulse 66   Temp (!) 97.5 F (36.4 C) (Oral)   Ht 5' 5 (1.651 m)   Wt 152 lb (68.9 kg)   LMP 12/26/2019   SpO2 98%   BMI 25.29 kg/m   BP Readings from Last 3  Encounters:  02/24/24 118/82  11/27/23 (!) 139/90  07/29/23 120/80      Physical Exam Constitutional:      General: She is not in acute distress.    Appearance: Normal appearance. She is normal weight. She is not ill-appearing.  HENT:     Head: Normocephalic.     Right Ear: Tympanic membrane normal.     Left Ear: Tympanic membrane normal.     Nose: Nose normal.     Mouth/Throat:     Mouth: Mucous membranes are moist.   Eyes:     Extraocular Movements: Extraocular movements intact.     Pupils: Pupils are equal, round, and reactive to light.   Neck:     Thyroid : Thyromegaly (slight) present. No thyroid  mass or thyroid  tenderness.   Cardiovascular:     Rate and Rhythm: Normal rate and regular rhythm.  Pulmonary:     Effort: Pulmonary effort is normal.     Breath sounds: Normal breath sounds.  Abdominal:     General: Abdomen is  flat. Bowel sounds are normal.     Palpations: Abdomen is soft.     Tenderness: There is no guarding or rebound.   Musculoskeletal:        General: Normal range of motion.     Cervical back: Normal range of motion.   Skin:    General: Skin is warm.     Capillary Refill: Capillary refill takes less than 2 seconds.   Neurological:     General: No focal deficit present.     Mental Status: She is alert.   Psychiatric:        Mood and Affect: Mood normal.        Behavior: Behavior normal.        Thought Content: Thought content normal.        Judgment: Judgment normal.          Assessment & Plan:  Encounter for screening mammogram for malignant neoplasm of breast -     3D Screening Mammogram, Left and Right; Future  Pedal edema Assessment & Plan: Trial hydrochlorothiazide 12.5 mg prn  Compression stockings advised, elevate legs, watch salt in diet.    Orders: -     hydroCHLOROthiazide; Take one po every day prn edema  Dispense: 30 capsule; Refill: 1  Mixed hyperlipidemia Assessment & Plan: Ordered lipid panel, pending results. Work on low cholesterol diet and exercise as tolerated   Orders: -     Lipid panel  Vitamin D  deficiency Assessment & Plan: Ordered vitamin d  pending results.    Orders: -     VITAMIN D  25 Hydroxy (Vit-D Deficiency, Fractures); Future  Low serum vitamin B12  Encounter for general adult medical examination without abnormal findings Assessment & Plan: Patient Counseling(The following topics were reviewed):  Preventative care handout given to pt  Health maintenance and immunizations reviewed. Please refer to Health maintenance section. Pt advised on safe sex, wearing seatbelts in car, and proper nutrition labwork ordered today for annual Dental health: Discussed importance of regular tooth brushing, flossing, and dental visits. 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  Currently asymptomatic  Orders: -     TSH -     T4, free -     T3, free -  Thyroid  Peroxidase Antibodies (TPO) (REFL)      Follow-up: Return in about 1 year (around 02/23/2025) for f/u CPE.   Ginger Patrick, FNP

## 2024-02-24 NOTE — Addendum Note (Signed)
 Addended by: SEBASTIAN DANNA GRADE on: 02/24/2024 11:40 AM   Modules accepted: Orders

## 2024-02-25 ENCOUNTER — Ambulatory Visit: Payer: Self-pay | Admitting: Family

## 2024-02-25 DIAGNOSIS — E063 Autoimmune thyroiditis: Secondary | ICD-10-CM

## 2024-02-25 LAB — THYROID PEROXIDASE ANTIBODIES (TPO) (REFL): Thyroperoxidase Ab SerPl-aCnc: 155 [IU]/mL — ABNORMAL HIGH (ref <9)

## 2024-03-02 DIAGNOSIS — E063 Autoimmune thyroiditis: Secondary | ICD-10-CM | POA: Insufficient documentation

## 2024-03-10 ENCOUNTER — Encounter: Payer: Self-pay | Admitting: Dermatology

## 2024-03-10 ENCOUNTER — Ambulatory Visit (INDEPENDENT_AMBULATORY_CARE_PROVIDER_SITE_OTHER): Admitting: Dermatology

## 2024-03-10 DIAGNOSIS — L988 Other specified disorders of the skin and subcutaneous tissue: Secondary | ICD-10-CM

## 2024-03-10 NOTE — Patient Instructions (Signed)

## 2024-03-10 NOTE — Progress Notes (Signed)
   Follow-Up Visit   Subjective  Tina Allen is a 58 y.o. female who presents for the following: Botox for facial elastosis  The following portions of the chart were reviewed this encounter and updated as appropriate: medications, allergies, medical history  Review of Systems:  No other skin or systemic complaints except as noted in HPI or Assessment and Plan.  Objective  Well appearing patient in no apparent distress; mood and affect are within normal limits.  A focused examination was performed of the face.  Relevant physical exam findings are noted in the Assessment and Plan.  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  Informed consent was obtained.  Preparation: The area was cleansed with alcohol.  Procedure Details:  Botox was injected into the dermis with a 30-gauge needle. Pressure applied to any bleeding. Ice packs offered for swelling.  Lot Number:  [id number]  Expiration:  11/2025  Total Units Injected:  45  Plan: Tylenol  may be used for headache.  Allow 2 weeks before returning to clinic for additional dosing as needed. Patient will call for any problems.    Return for 3 - 4 month botox .  IEleanor Blush, CMA, am acting as scribe for Alm Rhyme, MD.   Documentation: I have reviewed the above documentation for accuracy and completeness, and I agree with the above.  Alm Rhyme, MD

## 2024-05-01 ENCOUNTER — Encounter

## 2024-05-21 ENCOUNTER — Ambulatory Visit: Payer: BC Managed Care – PPO | Admitting: Dermatology

## 2024-05-29 ENCOUNTER — Ambulatory Visit
Admission: RE | Admit: 2024-05-29 | Discharge: 2024-05-29 | Disposition: A | Discharge status: Home / Self Care | Source: Ambulatory Visit | Attending: Family | Admitting: Family

## 2024-05-29 DIAGNOSIS — Z1231 Encounter for screening mammogram for malignant neoplasm of breast: Secondary | ICD-10-CM

## 2024-06-02 ENCOUNTER — Encounter: Payer: Self-pay | Admitting: Dermatology

## 2024-06-02 ENCOUNTER — Ambulatory Visit: Admitting: Dermatology

## 2024-06-02 DIAGNOSIS — L578 Other skin changes due to chronic exposure to nonionizing radiation: Secondary | ICD-10-CM | POA: Diagnosis not present

## 2024-06-02 DIAGNOSIS — D239 Other benign neoplasm of skin, unspecified: Secondary | ICD-10-CM

## 2024-06-02 DIAGNOSIS — L821 Other seborrheic keratosis: Secondary | ICD-10-CM

## 2024-06-02 DIAGNOSIS — W908XXA Exposure to other nonionizing radiation, initial encounter: Secondary | ICD-10-CM | POA: Diagnosis not present

## 2024-06-02 DIAGNOSIS — D2272 Melanocytic nevi of left lower limb, including hip: Secondary | ICD-10-CM

## 2024-06-02 DIAGNOSIS — H02729 Madarosis of unspecified eye, unspecified eyelid and periocular area: Secondary | ICD-10-CM

## 2024-06-02 DIAGNOSIS — D225 Melanocytic nevi of trunk: Secondary | ICD-10-CM | POA: Diagnosis not present

## 2024-06-02 DIAGNOSIS — L814 Other melanin hyperpigmentation: Secondary | ICD-10-CM | POA: Diagnosis not present

## 2024-06-02 DIAGNOSIS — D2371 Other benign neoplasm of skin of right lower limb, including hip: Secondary | ICD-10-CM

## 2024-06-02 DIAGNOSIS — D1801 Hemangioma of skin and subcutaneous tissue: Secondary | ICD-10-CM

## 2024-06-02 DIAGNOSIS — Z1283 Encounter for screening for malignant neoplasm of skin: Secondary | ICD-10-CM | POA: Diagnosis not present

## 2024-06-02 DIAGNOSIS — Z86018 Personal history of other benign neoplasm: Secondary | ICD-10-CM

## 2024-06-02 DIAGNOSIS — Z7189 Other specified counseling: Secondary | ICD-10-CM

## 2024-06-02 DIAGNOSIS — L658 Other specified nonscarring hair loss: Secondary | ICD-10-CM

## 2024-06-02 DIAGNOSIS — Z79899 Other long term (current) drug therapy: Secondary | ICD-10-CM

## 2024-06-02 DIAGNOSIS — L820 Inflamed seborrheic keratosis: Secondary | ICD-10-CM | POA: Diagnosis not present

## 2024-06-02 DIAGNOSIS — L989 Disorder of the skin and subcutaneous tissue, unspecified: Secondary | ICD-10-CM | POA: Diagnosis not present

## 2024-06-02 DIAGNOSIS — D229 Melanocytic nevi, unspecified: Secondary | ICD-10-CM

## 2024-06-02 DIAGNOSIS — D492 Neoplasm of unspecified behavior of bone, soft tissue, and skin: Secondary | ICD-10-CM

## 2024-06-02 DIAGNOSIS — L988 Other specified disorders of the skin and subcutaneous tissue: Secondary | ICD-10-CM

## 2024-06-02 MED ORDER — BIMATOPROST 0.03 % EX SOLN: CUTANEOUS | 11 refills | Status: AC

## 2024-06-02 NOTE — Patient Instructions (Addendum)

## 2024-06-02 NOTE — Progress Notes (Unsigned)
 Follow-Up Visit   Subjective  Tina Allen is a 58 y.o. female who presents for the following: Skin Cancer Screening and Full Body Skin Exam hx of dysplastic nevus, hx of bump R inframammary, sore, scaly patch L shoulder, brown spot L lower leg, crappy skin arms botox  The patient presents for Total-Body Skin Exam (TBSE) for skin cancer screening and mole check. The patient has spots, moles and lesions to be evaluated, some may be new or changing and the patient may have concern these could be cancer.  The following portions of the chart were reviewed this encounter and updated as appropriate: medications, allergies, medical history  Review of Systems:  No other skin or systemic complaints except as noted in HPI or Assessment and Plan.  Objective  Well appearing patient in no apparent distress; mood and affect are within normal limits.  A full examination was performed including scalp, head, eyes, ears, nose, lips, neck, chest, axillae, abdomen, back, buttocks, bilateral upper extremities, bilateral lower extremities, hands, feet, fingers, toes, fingernails, and toenails. All findings within normal limits unless otherwise noted below.   Relevant physical exam findings are noted in the Assessment and Plan.  L top of shoulder x 1, R med eyebrow x 1, L side braline x 1, R forehead x 2, L lower eyelid x 1 (6) Stuck on waxy paps with erythema L post thigh near the buttocks 0.3cm irregular brown macule  L med calf 0.6cm Irregular brown macule   Injection map   Assessment & Plan   SKIN CANCER SCREENING PERFORMED TODAY.  ACTINIC DAMAGE - Chronic condition, secondary to cumulative UV/sun exposure - diffuse scaly erythematous macules with underlying dyspigmentation - Recommend daily broad spectrum sunscreen SPF 30+ to sun-exposed areas, reapply every 2 hours as needed.  - Staying in the shade or wearing long sleeves, sun glasses (UVA+UVB protection) and wide brim hats (4-inch brim  around the entire circumference of the hat) are also recommended for sun protection.  - Call for new or changing lesions.  LENTIGINES, SEBORRHEIC KERATOSES, HEMANGIOMAS - Benign normal skin lesions - Benign-appearing - Call for any changes  MELANOCYTIC NEVI - Tan-brown and/or pink-flesh-colored symmetric macules and papules - Benign appearing on exam today - Observation - Call clinic for new or changing moles - Recommend daily use of broad spectrum spf 30+ sunscreen to sun-exposed areas.   HISTORY OF DYSPLASTIC NEVUS No evidence of recurrence today Recommend regular full body skin exams Recommend daily broad spectrum sunscreen SPF 30+ to sun-exposed areas, reapply every 2 hours as needed.  Call if any new or changing lesions are noted between office visits  - L hip/flank above waistline, L mid back at braline 2.0cm lat to spine  HYPOTRICHOSIS Eyelashes  Exam: thinning eyelashes Treatment Plan: Cont Bimatoprost  (Latisse ) 0.03% ophthalmic sol qhs as directed  DERMATOFIBROMA R lat thigh Exam: Firm pink/brown papulenodule with dimple sign. Treatment Plan: A dermatofibroma is a benign growth possibly related to trauma, such as an insect bite, cut from shaving, or inflamed acne-type bump.  Treatment options to remove include shave or excision with resulting scar and risk of recurrence.  Since benign-appearing and not bothersome, will observe for now.    FACIAL ELASTOSIS face Exam: Rhytides and volume loss. Treatment Plan: Botox 50 units injected today to: - Frown complex 20 units - Forehead 5 units - Crows feet 10 units x 2 - Brow lift 2.5 units x 2   Location: frown complex, forehead, crows feet  Informed consent: Discussed  risks (infection, pain, bleeding, bruising, swelling, allergic reaction, paralysis of nearby muscles, eyelid droop, double vision, neck weakness, difficulty breathing, headache, undesirable cosmetic result, and need for additional treatment) and benefits of  the procedure, as well as the alternatives.  Informed consent was obtained.  Preparation: The area was cleansed with alcohol.  Procedure Details:  Botox was injected into the dermis with a 30-gauge needle. Pressure applied to any bleeding. Ice packs offered for swelling.  Lot Number:  [id number] Expiration:  05/2026  Total Units Injected:  50  Plan: Tylenol  may be used for headache.  Allow 2 weeks before returning to clinic for additional dosing as needed. Patient will call for any problems. Recommend daily broad spectrum sunscreen SPF 30+ to sun-exposed areas, reapply every 2 hours as needed. Call for new or changing lesions.  Staying in the shade or wearing long sleeves, sun glasses (UVA+UVB protection) and wide brim hats (4-inch brim around the entire circumference of the hat) are also recommended for sun protection.    INFLAMED SEBORRHEIC KERATOSIS (6) L top of shoulder x 1, R med eyebrow x 1, L side braline x 1, R forehead x 2, L lower eyelid x 1 (6) Symptomatic, irritating, patient would like treated. Destruction of lesion - L top of shoulder x 1, R med eyebrow x 1, L side braline x 1, R forehead x 2, L lower eyelid x 1 (6) Complexity: simple   Destruction method: cryotherapy   Informed consent: discussed and consent obtained   Timeout:  patient name, date of birth, surgical site, and procedure verified Lesion destroyed using liquid nitrogen: Yes   Region frozen until ice ball extended beyond lesion: Yes   Outcome: patient tolerated procedure well with no complications   Post-procedure details: wound care instructions given    NEOPLASM OF SKIN (2) L post thigh near the buttocks Epidermal / dermal shaving  Lesion diameter (cm):  0.3 Informed consent: discussed and consent obtained   Timeout: patient name, date of birth, surgical site, and procedure verified   Procedure prep:  Patient was prepped and draped in usual sterile fashion Prep type:  Isopropyl alcohol Anesthesia: the  lesion was anesthetized in a standard fashion   Anesthetic:  1% lidocaine  w/ epinephrine  1-100,000 buffered w/ 8.4% NaHCO3 Instrument used: flexible razor blade   Hemostasis achieved with: pressure, aluminum chloride and electrodesiccation   Outcome: patient tolerated procedure well   Post-procedure details: sterile dressing applied and wound care instructions given   Dressing type: bandage and petrolatum    Specimen 1 - Surgical pathology Differential Diagnosis: Nevus vs Dysplastic Nevus  Check Margins: yes 0.3cm irregular brown macule L med calf Epidermal / dermal shaving  Lesion diameter (cm):  0.6 Informed consent: discussed and consent obtained   Timeout: patient name, date of birth, surgical site, and procedure verified   Procedure prep:  Patient was prepped and draped in usual sterile fashion Prep type:  Isopropyl alcohol Anesthesia: the lesion was anesthetized in a standard fashion   Anesthetic:  1% lidocaine  w/ epinephrine  1-100,000 buffered w/ 8.4% NaHCO3 Instrument used: flexible razor blade   Hemostasis achieved with: pressure, aluminum chloride and electrodesiccation   Outcome: patient tolerated procedure well   Post-procedure details: sterile dressing applied and wound care instructions given   Dressing type: bandage (mupirocin)    Specimen 2 - Surgical pathology Differential Diagnosis: Nevus vs Dysplastic Nevus  Check Margins: yes 0.6cm Irregular brown macule Return for 3-48m Botox, 1 yr TBSE hx of Dysplastic Nevi.  I,  Grayce Saunas, RMA, am acting as scribe for Alm Rhyme, MD .   Documentation: I have reviewed the above documentation for accuracy and completeness, and I agree with the above.  Alm Rhyme, MD

## 2024-06-03 ENCOUNTER — Encounter: Payer: Self-pay | Admitting: Dermatology

## 2024-06-08 LAB — SURGICAL PATHOLOGY

## 2024-06-09 ENCOUNTER — Encounter: Payer: Self-pay | Admitting: Dermatology

## 2024-06-09 ENCOUNTER — Ambulatory Visit: Payer: Self-pay | Admitting: Dermatology

## 2024-06-09 NOTE — Telephone Encounter (Addendum)
 Called and discussed bx results with patient. She verbalized understanding and denied further questions. Will recheck areas at next follow up.  ----- Message from Alm Rhyme sent at 06/09/2024  9:47 AM EDT ----- FINAL DIAGNOSIS        1. Skin, L post thigh near the buttocks :       ATYPICAL LENTIGINOUS MELANOCYTIC PROLIFERATION, LIMITED MARGINS FREE        2. Skin, L med calf :       DYSPLASTIC JUNCTIONAL LENTIGINOUS NEVUS WITH MODERATE ATYPIA, IRRITATED, LIMITED       MARGINS FREE    1- Atypical Melanocytic Proliferation Limited Margins Free May need additional procedure if any evidence of recurrence Re-check next visit 2- Moderate Dysplastic Recheck next visit ----- Message ----- From: Interface, Lab In [facility] Sent: 06/09/2024   9:28 AM EDT To: Alm JAYSON Rhyme, MD

## 2024-06-24 DIAGNOSIS — H10413 Chronic giant papillary conjunctivitis, bilateral: Secondary | ICD-10-CM | POA: Diagnosis not present

## 2024-06-24 DIAGNOSIS — H5213 Myopia, bilateral: Secondary | ICD-10-CM | POA: Diagnosis not present

## 2024-07-27 ENCOUNTER — Encounter: Payer: Self-pay | Admitting: Emergency Medicine

## 2024-07-27 ENCOUNTER — Ambulatory Visit
Admission: EM | Admit: 2024-07-27 | Discharge: 2024-07-27 | Disposition: A | Discharge status: Home / Self Care | Attending: Emergency Medicine | Admitting: Emergency Medicine

## 2024-07-27 DIAGNOSIS — J0100 Acute maxillary sinusitis, unspecified: Secondary | ICD-10-CM

## 2024-07-27 MED ORDER — AMOXICILLIN 875 MG PO TABS: 875.0000 mg | ORAL_TABLET | Freq: Two times a day (BID) | ORAL | 0 refills | Status: AC

## 2024-07-27 NOTE — ED Triage Notes (Signed)
 Patient complains of sore throat, sinus pressure and  clear nasal drainage and headache x 3 days. Denies fever and sore throat at this time.  Rates headache 5/10. Patient states I have a sinus infection I get one  around this time of year every year.  Patient has taken Theraflu with no relief.

## 2024-07-27 NOTE — Discharge Instructions (Signed)
 Today you are being treated for sinus infection  Take amoxicillin  twice daily for 10 days    You can take Tylenol  and/or Ibuprofen  as needed for fever reduction and pain relief.   For cough: honey 1/2 to 1 teaspoon (you can dilute the honey in water or another fluid).  You can also use guaifenesin and dextromethorphan for cough. You can use a humidifier for chest congestion and cough.  If you don't have a humidifier, you can sit in the bathroom with the hot shower running.      For sore throat: try warm salt water gargles, cepacol lozenges, throat spray, warm tea or water with lemon/honey, popsicles or ice, or OTC cold relief medicine for throat discomfort.   For congestion: take a daily anti-histamine like Zyrtec, Claritin, and a oral decongestant, such as pseudoephedrine.  You can also use Flonase 1-2 sprays in each nostril daily.   It is important to stay hydrated: drink plenty of fluids (water, gatorade/powerade/pedialyte, juices, or teas) to keep your throat moisturized and help further relieve irritation/discomfort.

## 2024-07-27 NOTE — ED Provider Notes (Signed)
 [facility]    CSN: [id number] Arrival date & time: 07/27/24  9046      History   Chief Complaint Chief Complaint  Patient presents with   Sore Throat   Nasal Congestion   Facial Pain   Headache    HPI EULAR PANEK is a 58 y.o. female.   Patient presents for evaluation of nasal congestion, sinus pressure and pain to the bilateral cheeks radiating into the teeth, sore throat and intermittent headaches beginning 3 days ago.  No known sick contacts.  Tolerable to food and liquids.  Has attempted use of TheraFlu.  Endorses history of reoccurring sinus infection, had sinus surgery in early 2025.  Denies fever, ear pain, cough.    Past Medical History:  Diagnosis Date   COVID-19 08/2020   Dysplastic nevus 09/03/2019   Left hip/flank above waistline. Moderate atypia, limited margins free.    Dysplastic nevus 05/16/2023   left mid back at braline 2 cm lateral to spine, moderate   Dysplastic nevus 06/02/2024   left medial calf - moderate - will recheck at follow up   HSV-2 (herpes simplex virus 2) infection 2006   Vitamin D  deficiency    Wears contact lenses     Patient Active Problem List   Diagnosis Date Noted   Hashimoto's disease 03/02/2024   Pedal edema 02/24/2024   Thyroid  nodule 02/24/2024   External hemorrhoids 03/21/2023   Postmenopausal HRT (hormone replacement therapy) 01/01/2023   Arthritis 01/01/2023   Bilateral chronic knee pain 01/01/2023   Allergic rhinitis due to allergen 01/01/2023   Low serum vitamin B12 01/01/2023   Elevated LDL cholesterol level 01/01/2023   Vitamin D  deficiency 01/01/2023   Urine, incontinence, stress female 01/01/2023   Female genital prolapse 01/01/2023   History of colonic polyps    Tubular adenoma of colon 05/30/2021   Hot flashes due to menopause 09/30/2019   Hyperlipidemia 09/30/2019   Esophagitis 08/15/2015   Gallstones 06/27/2015   Depression with anxiety 04/23/2013   Familial hemochromatosis 09/06/2011     Past Surgical History:  Procedure Laterality Date   CERVICAL BIOPSY  W/ LOOP ELECTRODE EXCISION  1996   DYSPLASIA   COLONOSCOPY N/A 01/15/2022   Procedure: COLONOSCOPY;  Surgeon: Unk Corinn Skiff, MD;  Location: [facility];  Service: Gastroenterology;  Laterality: N/A;   ENDOSCOPIC CONCHA BULLOSA RESECTION Bilateral 11/27/2023   Procedure: EXCISION, CONCHA BULLOSA, ENDOSCOPIC;  Surgeon: Milissa Hamming, MD;  Location: [facility];  Service: ENT;  Laterality: Bilateral;   IMAGE GUIDED SINUS SURGERY Bilateral 11/27/2023   Procedure: SINUS SURGERY, WITH IMAGING GUIDANCE;  Surgeon: Milissa Hamming, MD;  Location: [facility];  Service: ENT;  Laterality: Bilateral;   TURBINATE REDUCTION Bilateral 11/27/2023   Procedure: REDUCTION, NASAL TURBINATE;  Surgeon: Milissa Hamming, MD;  Location: [facility];  Service: ENT;  Laterality: Bilateral;    OB History     Gravida  2   Para  1   Term  1   Preterm      AB  1   Living  1      SAB  1   IAB      Ectopic      Multiple      Live Births  1            Home Medications    Prior to Admission medications   Medication Sig Start Date End Date Taking? Authorizing Provider  ALPRAZolam  (XANAX ) 0.25 MG tablet TAKE 1 TABLET BY  MOUTH AT BEDTIME AS NEEDED FOR ANXIETY 10/26/22   Webb, Padonda B, FNP  bimatoprost  (LATISSE ) 0.03 % ophthalmic solution PLACE 1 APPLICATION INTO BOTH EYES AS DIRECTED. PLACE ONE DROP ON APPLICATOR AND APPLY EVENLY ALONG THE SKIN OF THE UPPER EYELID AT BASE OF EYELASHES ONCE DAILY AT BEDTIME REPEAT PROCEDURE FOR SECOND EYE (USE A CLEAN APPLICATOR). 06/02/24   Hester Alm BROCKS, MD  estradiol  (ESTRACE ) 0.5 MG tablet Take 1 tablet daily 12/11/23   Dallie Vera GAILS, MD  hydrochlorothiazide  (MICROZIDE ) 12.5 MG capsule Take one po every day prn edema 02/24/24   Corwin Antu, FNP  hydrocortisone  2.5 % cream Apply topically 3 (three) times daily as needed. 03/21/23   Jimmy Charlie FERNS,  MD  ondansetron  (ZOFRAN ) 4 MG tablet Take 1 tablet (4 mg total) by mouth every 8 (eight) hours as needed for nausea or vomiting. 11/27/23   Milissa Hamming, MD  progesterone  (PROMETRIUM ) 100 MG capsule Take 1 capsule (100 mg total) by mouth at bedtime. 11/29/23   Dallie Vera GAILS, MD  tretinoin  (RETIN-A ) 0.025 % cream Apply pea sized amount to face qhs 05/16/23   Hester Alm BROCKS, MD    Family History Family History  Problem Relation Age of Onset   Aneurysm Mother        passed age 7   Hypertension Mother    Stroke Father        passed age 29 , smoker   Alcohol abuse Father    Alcohol abuse Brother    Breast cancer Maternal Aunt    Hypertension Maternal Aunt    Hypertension Maternal Grandmother    Breast cancer Maternal Grandmother        diagnosed in her 68's   Hypertension Maternal Grandfather    Cancer Maternal Grandfather        unsure, brain?    Social History Social History   Tobacco Use   Smoking status: Never   Smokeless tobacco: Never  Vaping Use   Vaping status: Never Used  Substance Use Topics   Alcohol use: Yes    Comment: 4 drinks a week   Drug use: No     Allergies   Patient has no known allergies.   Review of Systems Review of Systems  Constitutional: Negative.   HENT:  Positive for congestion, sinus pressure, sinus pain and sore throat. Negative for dental problem, drooling, ear discharge, ear pain, facial swelling, hearing loss, mouth sores, nosebleeds, postnasal drip, rhinorrhea, sneezing, tinnitus, trouble swallowing and voice change.   Neurological:  Positive for headaches. Negative for dizziness, tremors, seizures, syncope, facial asymmetry, speech difficulty, weakness, light-headedness and numbness.     Physical Exam Triage Vital Signs ED Triage Vitals  Encounter Vitals Group     BP 07/27/24 1142 118/75     Girls Systolic BP Percentile --      Girls Diastolic BP Percentile --      Boys Systolic BP Percentile --      Boys Diastolic BP  Percentile --      Pulse Rate 07/27/24 1142 78     Resp 07/27/24 1142 20     Temp 07/27/24 1142 98.3 F (36.8 C)     Temp Source 07/27/24 1142 Oral     SpO2 07/27/24 1142 96 %     Weight --      Height --      Head Circumference --      Peak Flow --      Pain Score 07/27/24 1141 5  Pain Loc --      Pain Education --      Exclude from Growth Chart --    No data found.  Updated Vital Signs BP 118/75 (BP Location: Left Arm)   Pulse 78   Temp 98.3 F (36.8 C) (Oral)   Resp 20   LMP 12/26/2019   SpO2 96%   Visual Acuity Right Eye Distance:   Left Eye Distance:   Bilateral Distance:    Right Eye Near:   Left Eye Near:    Bilateral Near:     Physical Exam Constitutional:      Appearance: Normal appearance.  HENT:     Right Ear: Tympanic membrane, ear canal and external ear normal.     Left Ear: Tympanic membrane, ear canal and external ear normal.     Nose: Congestion present.     Mouth/Throat:     Pharynx: No oropharyngeal exudate or posterior oropharyngeal erythema.  Eyes:     Extraocular Movements: Extraocular movements intact.  Pulmonary:     Effort: Pulmonary effort is normal.  Lymphadenopathy:     Cervical: Cervical adenopathy present.  Neurological:     Mental Status: She is alert and oriented to person, place, and time. Mental status is at baseline.      UC Treatments / Results  Labs (all labs ordered are listed, but only abnormal results are displayed) Labs Reviewed - No data to display  EKG   Radiology No results found.  Procedures Procedures (including critical care time)  Medications Ordered in UC Medications - No data to display  Initial Impression / Assessment and Plan / UC Course  I have reviewed the triage vital signs and the nursing notes.  Pertinent labs & imaging results that were available during my care of the patient were reviewed by me and considered in my medical decision making (see chart for details).  Acute  nonrecurrent maxillary sinusitis  Patient is in no signs of distress nor toxic appearing.  Vital signs are stable.  Low suspicion for pneumonia, pneumothorax or bronchitis and therefore will defer imaging.  Symptoms present for 2 days most likely viral etiology, due to history empirically placed on amoxicillin .May use additional over-the-counter medications as needed for supportive care.  May follow-up with urgent care as needed if symptoms persist or worsen.  Final Clinical Impressions(s) / UC Diagnoses   Final diagnoses:  None   Discharge Instructions   None    ED Prescriptions   None    PDMP not reviewed this encounter.   Teresa Shelba SAUNDERS, NP 07/27/24 1330

## 2024-08-03 ENCOUNTER — Encounter: Payer: Self-pay | Admitting: Obstetrics and Gynecology

## 2024-08-03 ENCOUNTER — Ambulatory Visit: Admitting: Obstetrics and Gynecology

## 2024-08-03 VITALS — BP 116/72 | HR 76 | Temp 98.1°F | Ht 64.75 in | Wt 152.0 lb

## 2024-08-03 DIAGNOSIS — Z13820 Encounter for screening for osteoporosis: Secondary | ICD-10-CM

## 2024-08-03 DIAGNOSIS — Z01419 Encounter for gynecological examination (general) (routine) without abnormal findings: Secondary | ICD-10-CM

## 2024-08-03 DIAGNOSIS — N951 Menopausal and female climacteric states: Secondary | ICD-10-CM | POA: Diagnosis not present

## 2024-08-03 DIAGNOSIS — E559 Vitamin D deficiency, unspecified: Secondary | ICD-10-CM

## 2024-08-03 DIAGNOSIS — N812 Incomplete uterovaginal prolapse: Secondary | ICD-10-CM

## 2024-08-03 DIAGNOSIS — F419 Anxiety disorder, unspecified: Secondary | ICD-10-CM | POA: Diagnosis not present

## 2024-08-03 DIAGNOSIS — Z1331 Encounter for screening for depression: Secondary | ICD-10-CM | POA: Diagnosis not present

## 2024-08-03 MED ORDER — PROGESTERONE MICRONIZED 100 MG PO CAPS: 100.0000 mg | ORAL_CAPSULE | Freq: Every day | ORAL | 3 refills | Status: AC

## 2024-08-03 MED ORDER — ESTRADIOL 0.5 MG PO TABS: ORAL_TABLET | ORAL | 3 refills | Status: AC

## 2024-08-03 NOTE — Assessment & Plan Note (Signed)
 Reviewed principles of medically indicated HRT: lowest dose of HRT that provided symptomatic relief for the shortest period of time. Therefore labs are not indicated. VMS are currently well controlled and we attempted to wean dosing last year. Reviewed risks of HRT- cancer, DVT, stroke, MI. Patient would like to continue current dosing at this time

## 2024-08-03 NOTE — Patient Instructions (Signed)
 For patients under 50-60yo, I recommend 1200mg  calcium  daily and 600IU of vitamin D daily. For patients over 60yo, I recommend 1200mg  calcium  daily and 800IU of vitamin D daily.  Health Maintenance, Female Adopting a healthy lifestyle and getting preventive care are important in promoting health and wellness. Ask your health care provider about: The right schedule for you to have regular tests and exams. Things you can do on your own to prevent diseases and keep yourself healthy. What should I know about diet, weight, and exercise? Eat a healthy diet  Eat a diet that includes plenty of vegetables, fruits, low-fat dairy products, and lean protein. Do not eat a lot of foods that are high in solid fats, added sugars, or sodium. Maintain a healthy weight Body mass index (BMI) is used to identify weight problems. It estimates body fat based on height and weight. Your health care provider can help determine your BMI and help you achieve or maintain a healthy weight. Get regular exercise Get regular exercise. This is one of the most important things you can do for your health. Most adults should: Exercise for at least 150 minutes each week. The exercise should increase your heart rate and make you sweat (moderate-intensity exercise). Do strengthening exercises at least twice a week. This is in addition to the moderate-intensity exercise. Spend less time sitting. Even light physical activity can be beneficial. Watch cholesterol and blood lipids Have your blood tested for lipids and cholesterol at 60 years of age, then have this test every 5 years. Have your cholesterol levels checked more often if: Your lipid or cholesterol levels are high. You are older than 60 years of age. You are at high risk for heart disease. What should I know about cancer screening? Depending on your health history and family history, you may need to have cancer screening at various ages. This may include screening  for: Breast cancer. Cervical cancer. Colorectal cancer. Skin cancer. Lung cancer. What should I know about heart disease, diabetes, and high blood pressure? Blood pressure and heart disease High blood pressure causes heart disease and increases the risk of stroke. This is more likely to develop in people who have high blood pressure readings or are overweight. Have your blood pressure checked: Every 3-5 years if you are 25-57 years of age. Every year if you are 60 years old or older. Diabetes Have regular diabetes screenings. This checks your fasting blood sugar level. Have the screening done: Once every three years after age 60 if you are at a normal weight and have a low risk for diabetes. More often and at a younger age if you are overweight or have a high risk for diabetes. What should I know about preventing infection? Hepatitis B If you have a higher risk for hepatitis B, you should be screened for this virus. Talk with your health care provider to find out if you are at risk for hepatitis B infection. Hepatitis C Testing is recommended for: Everyone born from [birth year] through 1965. Anyone with known risk factors for hepatitis C. Sexually transmitted infections (STIs) Get screened for STIs, including gonorrhea and chlamydia, if: You are sexually active and are younger than 60 years of age. You are older than 60 years of age and your health care provider tells you that you are at risk for this type of infection. Your sexual activity has changed since you were last screened, and you are at increased risk for chlamydia or gonorrhea. Ask your health care provider if  you are at risk. Ask your health care provider about whether you are at high risk for HIV. Your health care provider may recommend a prescription medicine to help prevent HIV infection. If you choose to take medicine to prevent HIV, you should first get tested for HIV. You should then be tested every 3 months for as long as you  are taking the medicine. Osteoporosis and menopause Osteoporosis is a disease in which the bones lose minerals and strength with aging. This can result in bone fractures. If you are 60 years old or older, or if you are at risk for osteoporosis and fractures, ask your health care provider if you should: Be screened for bone loss. Take a calcium  or vitamin D supplement to lower your risk of fractures. Be given hormone replacement therapy (HRT) to treat symptoms of menopause. Follow these instructions at home: Alcohol use Do not drink alcohol if: Your health care provider tells you not to drink. You are pregnant, may be pregnant, or are planning to become pregnant. If you drink alcohol: Limit how much you have to: 0-1 drink a day. Know how much alcohol is in your drink. In the [place] one drink equals one 12 oz bottle of beer (355 mL), one 5 oz glass of wine (148 mL), or one 1 oz glass of hard liquor (44 mL). Lifestyle Do not use any products that contain nicotine or tobacco. These products include cigarettes, chewing tobacco, and vaping devices, such as e-cigarettes. If you need help quitting, ask your health care provider. Do not use street drugs. Do not share needles. Ask your health care provider for help if you need support or information about quitting drugs. General instructions Schedule regular health, dental, and eye exams. Stay current with your vaccines. Tell your health care provider if: You often feel depressed. You have ever been abused or do not feel safe at home. Summary Adopting a healthy lifestyle and getting preventive care are important in promoting health and wellness. Follow your health care provider's instructions about healthy diet, exercising, and getting tested or screened for diseases. Follow your health care provider's instructions on monitoring your cholesterol and blood pressure. This information is not intended to replace advice given to you by your health  care provider. Make sure you discuss any questions you have with your health care provider. Document Revised: 01/02/2021 Document Reviewed: 01/02/2021 Elsevier Patient Education  2024 [employer].

## 2024-08-03 NOTE — Progress Notes (Signed)
 58 y.o. 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 Realtor. PCP: Corwin Antu, FNP  Attempted to wean HRT but started VMS again, so she continued medications. No VMS since. Feels great. She has questions about indication for hormone testing with HRT, xanax  refill, bone density screening. Uses xanax  to help with occ sleep disturbances and anxiety at night, notes some stress from work   Postmenopausal bleeding: see above Pelvic discharge or pain: none Breast mass, nipple discharge or skin changes : none  Last PAP: 2021 NIL, HPV neg No results found for: DIAGPAP, HPVHIGH, ADEQPAP  Last mammogram: 05/29/24 BIRADS 1, density c  Last colonoscopy: 01/15/22, q42yr  Last DXA: never Sexually active: no  Exercising: walks her dog ~30 mins a day, stopped exercising after she pulled her back doing core exercises, seeing a chiropractor Smoker: never  Garment/textile Technologist Visit from 08/03/2024 in [facility]  PHQ-2 Total Score 0   Flowsheet Row Office Visit from 02/24/2024 in [facility]  PHQ-9 Total Score 0    GYN HISTORY: LEEP (2001), LSIL 2010, normal PAP since  OB History  Gravida Para Term Preterm AB Living  2 1 1  1 1   SAB IAB Ectopic Multiple Live Births  1    1    # Outcome Date GA Lbr Len/2nd Weight Sex Type Anes PTL Lv  2 SAB           1 Term     F Vag-Spont  N LIV    Past Medical History:  Diagnosis Date   COVID-19 08/2020   Dysplastic nevus 09/03/2019   Left hip/flank above waistline. Moderate atypia, limited margins free.    Dysplastic nevus 05/16/2023   left mid back at braline 2 cm lateral to spine, moderate   Dysplastic nevus 06/02/2024   left medial calf - moderate - will recheck at follow up   HSV-2 (herpes simplex virus 2) infection 2006   Vitamin D  deficiency    Wears  contact lenses     Past Surgical History:  Procedure Laterality Date   CERVICAL BIOPSY  W/ LOOP ELECTRODE EXCISION  1996   DYSPLASIA   COLONOSCOPY N/A 01/15/2022   Procedure: COLONOSCOPY;  Surgeon: Unk Corinn Skiff, MD;  Location: [facility];  Service: Gastroenterology;  Laterality: N/A;   ENDOSCOPIC CONCHA BULLOSA RESECTION Bilateral 11/27/2023   Procedure: EXCISION, CONCHA BULLOSA, ENDOSCOPIC;  Surgeon: Milissa Hamming, MD;  Location: [facility];  Service: ENT;  Laterality: Bilateral;   IMAGE GUIDED SINUS SURGERY Bilateral 11/27/2023   Procedure: SINUS SURGERY, WITH IMAGING GUIDANCE;  Surgeon: Milissa Hamming, MD;  Location: [facility];  Service: ENT;  Laterality: Bilateral;   TURBINATE REDUCTION Bilateral 11/27/2023   Procedure: REDUCTION, NASAL TURBINATE;  Surgeon: Milissa Hamming, MD;  Location: [facility];  Service: ENT;  Laterality: Bilateral;    Current Outpatient Medications on File Prior to Visit  Medication Sig Dispense Refill   ALPRAZolam  (XANAX ) 0.25 MG tablet TAKE 1 TABLET BY MOUTH AT BEDTIME AS NEEDED FOR ANXIETY 30 tablet 0   amoxicillin  (AMOXIL ) 875 MG tablet Take 1 tablet (875 mg total) by mouth 2 (two) times daily for 10 days. 20 tablet 0   bimatoprost  (LATISSE ) 0.03 % ophthalmic solution PLACE 1 APPLICATION INTO BOTH EYES AS DIRECTED. PLACE ONE DROP ON APPLICATOR AND APPLY EVENLY ALONG THE SKIN  OF THE UPPER EYELID AT BASE OF EYELASHES ONCE DAILY AT BEDTIME REPEAT PROCEDURE FOR SECOND EYE (USE A CLEAN APPLICATOR). 5 mL 11   hydrocortisone  2.5 % cream Apply topically 3 (three) times daily as needed. 28 g 3   ondansetron  (ZOFRAN ) 4 MG tablet Take 1 tablet (4 mg total) by mouth every 8 (eight) hours as needed for nausea or vomiting. 20 tablet 0   tretinoin  (RETIN-A ) 0.025 % cream Apply pea sized amount to face qhs 45 g 11   No current facility-administered medications on file prior to visit.    Social History   Socioeconomic History    Marital status: Divorced    Spouse name: Not on file   Number of children: 1   Years of education: scientist, product/process development college   Highest education level: Not on file  Occupational History   Not on file  Tobacco Use   Smoking status: Never   Smokeless tobacco: Never  Vaping Use   Vaping status: Never Used  Substance and Sexual Activity   Alcohol use: Yes    Comment: 4 drinks a week   Drug use: No   Sexual activity: Not Currently    Partners: Male    Birth control/protection: Abstinence, Post-menopausal  Other Topics Concern   Not on file  Social History Narrative   09/30/19   From: [place]   Living: with daughter   Work: Veterinary Surgeon with [employer]      Family: Daughter - Nurse, [employer] (2003) - good relationship       Enjoys: travel, got to the beach/mountains      Exercise: not currently, achy knees   Diet: working on healthy eating now      It Sales Professional belts: Yes    Guns: No   Safe in relationships: Yes    Social Drivers of Corporate Investment Banker Strain: Low Risk  (03/21/2023)   Overall Financial Resource Strain (CARDIA)    Difficulty of Paying Living Expenses: Not very hard  Food Insecurity: No Food Insecurity (03/21/2023)   Hunger Vital Sign    Worried About Running Out of Food in the Last Year: Never true    Ran Out of Food in the Last Year: Never true  Transportation Needs: Not on file  Physical Activity: Insufficiently Active (03/21/2023)   Exercise Vital Sign    Days of Exercise per Week: 3 days    Minutes of Exercise per Session: 30 min  Stress: No Stress Concern Present (03/21/2023)   [employer] of Occupational Health - Occupational Stress Questionnaire    Feeling of Stress : Only a little  Social Connections: Not on file  Intimate Partner Violence: Not on file    Family History  Problem Relation Age of Onset   Aneurysm Mother        passed age 31   Hypertension Mother    Stroke Father        passed age 41 , smoker   Alcohol abuse Father    Alcohol abuse  Brother    Breast cancer Maternal Aunt    Hypertension Maternal Aunt    Hypertension Maternal Grandmother    Breast cancer Maternal Grandmother        diagnosed in her 87's   Hypertension Maternal Grandfather    Cancer Maternal Grandfather        unsure, brain?    No Known Allergies    PE Today's Vitals   08/03/24 1403  BP: 116/72  Pulse: 76  Temp: 98.1  F (36.7 C)  TempSrc: Oral  SpO2: 98%  Weight: 152 lb (68.9 kg)  Height: 5' 4.75 (1.645 m)    Body mass index is 25.49 kg/m.  Physical Exam Vitals reviewed. Exam conducted with a chaperone present.  Constitutional:      General: She is not in acute distress.    Appearance: Normal appearance.  HENT:     Head: Normocephalic and atraumatic.     Nose: Nose normal.  Eyes:     Extraocular Movements: Extraocular movements intact.     Conjunctiva/sclera: Conjunctivae normal.  Pulmonary:     Effort: Pulmonary effort is normal.  Chest:     Chest wall: No mass or tenderness.  Breasts:    Right: Normal. No swelling, mass, nipple discharge, skin change or tenderness.     Left: Normal. No swelling, mass, nipple discharge, skin change or tenderness.  Abdominal:     General: There is no distension.     Palpations: Abdomen is soft.     Tenderness: There is no abdominal tenderness.  Genitourinary:    General: Normal vulva.     Exam position: Lithotomy position.     Urethra: No prolapse.     Vagina: Normal. No vaginal discharge or bleeding.     Cervix: Normal. No lesion.     Uterus: Normal. Not enlarged and not tender.      Adnexa: Right adnexa normal and left adnexa normal.     Comments: Grade 2-3 rectocele Musculoskeletal:        General: Normal range of motion.     Cervical back: Normal range of motion.  Lymphadenopathy:     Upper Body:     Right upper body: No axillary adenopathy.     Left upper body: No axillary adenopathy.     Lower Body: No right inguinal adenopathy. No left inguinal adenopathy.  Skin:     General: Skin is warm and dry.  Neurological:     General: No focal deficit present.     Mental Status: She is alert.  Psychiatric:        Mood and Affect: Mood normal.        Behavior: Behavior normal.      Assessment and Plan:        Well woman exam with routine gynecological exam Assessment & Plan: Cervical cancer screening performed according to ASCCP guidelines. Encouraged annual mammogram screening Colonoscopy UTD DXA: will order 2/2 low dit D and menopausa  Labs and immunizations with her primary Encouraged safe sexual practices as indicated Encouraged healthy lifestyle practices with diet and exercise For patients under 50-70yo, I recommend 1200mg  calcium daily and 600IU of vitamin D  daily.    Hot flashes due to menopause Assessment & Plan: Reviewed principles of medically indicated HRT: lowest dose of HRT that provided symptomatic relief for the shortest period of time. Therefore labs are not indicated. VMS are currently well controlled and we attempted to wean dosing last year. Reviewed risks of HRT- cancer, DVT, stroke, MI. Patient would like to continue current dosing at this time  Orders: -     Estradiol ; Take 1 tablet daily  Dispense: 90 tablet; Refill: 3 -     Progesterone ; Take 1 capsule (100 mg total) by mouth at bedtime.  Dispense: 90 capsule; Refill: 3  Vitamin D  insufficiency -     DG Bone Density; Future  Screening for osteoporosis -     DG Bone Density; Future  Anxiety Negative depression screening Longstanding hx of anxiety,  previously managed with SSRIs and prescribed xanax  PRN by GYN providers  Stopped SSRI due to side effects in 2017 but has been maintained on xanax  PRN Reviewed with patient that I recommend use of SSRIs over benzodiazepines for management of anxiety even if episodic Could consider CBT or psych referral Patient wants to f/u with PCP  Incomplete uterovaginal prolapse Assessment & Plan: Discussed importance of kegels and use  of pessary Consider referral back to urology/UROGYN for surgical management Encourage PT for core rehab, will consider  Vera LULLA Pa, MD

## 2024-08-03 NOTE — Assessment & Plan Note (Signed)
 Discussed importance of kegels and use of pessary Consider referral back to urology/UROGYN for surgical management

## 2024-08-03 NOTE — Assessment & Plan Note (Addendum)
 Cervical cancer screening performed according to ASCCP guidelines. Encouraged annual mammogram screening Colonoscopy UTD DXA: will order 2/2 low dit D and menopausa  Labs and immunizations with her primary Encouraged safe sexual practices as indicated Encouraged healthy lifestyle practices with diet and exercise For patients under 50-60yo, I recommend 1200mg  calcium daily and 600IU of vitamin D  daily.

## 2024-09-08 ENCOUNTER — Encounter: Payer: Self-pay | Admitting: Dermatology

## 2024-09-08 ENCOUNTER — Ambulatory Visit (INDEPENDENT_AMBULATORY_CARE_PROVIDER_SITE_OTHER): Payer: Self-pay | Admitting: Dermatology

## 2024-09-08 DIAGNOSIS — L988 Other specified disorders of the skin and subcutaneous tissue: Secondary | ICD-10-CM

## 2024-09-08 NOTE — Patient Instructions (Signed)

## 2024-09-08 NOTE — Progress Notes (Signed)
" ° °  Follow-Up Visit   Subjective  Tina Allen is a 59 y.o. female who presents for the following: Botox for facial elastosis  The following portions of the chart were reviewed this encounter and updated as appropriate: medications, allergies, medical history  Review of Systems:  No other skin or systemic complaints except as noted in HPI or Assessment and Plan.  Objective  Well appearing patient in no apparent distress; mood and affect are within normal limits.  A focused examination was performed of the face.  Relevant physical exam findings are noted in the Assessment and Plan.  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 fillers at oral commissures, inf nasolabial folds, superficial creases oral commissure area, Restylane Refyne  Botox 50 units injected today to: - Frown complex 20 units - Brow lift 2.5 units x 2 - Forehead 5 units - Crow's feet 10 units x 2  Location: frown complex, brow lift, forehead, crow's feet  Informed consent: Discussed risks (infection, pain, bleeding, bruising, swelling, allergic reaction, paralysis of nearby muscles, eyelid droop, double vision, neck weakness, difficulty breathing, headache, undesirable cosmetic result, and need for additional treatment) and benefits of the procedure, as well as the alternatives.  Informed consent was obtained.  Preparation: The area was cleansed with alcohol.  Procedure Details:  Botox was injected into the dermis with a 30-gauge needle. Pressure applied to any bleeding. Ice packs offered for swelling.  Lot Number:  [id number] Expiration:  07/2026  Total Units Injected:  50  Plan: Tylenol  may be used for headache.  Allow 2 weeks before returning to clinic for additional dosing as needed. Patient will call for any problems.  Return for 3-62m Botox, fillers with Dr. Jackquline, Restylane Refyne.  I, Grayce Saunas, RMA, am acting as scribe for Alm Rhyme, MD  .   Documentation: I have reviewed the above documentation for accuracy and completeness, and I agree with the above.  Alm Rhyme, MD      "

## 2024-10-21 ENCOUNTER — Ambulatory Visit: Admitting: Dermatology

## 2025-01-05 ENCOUNTER — Ambulatory Visit: Admitting: Dermatology

## 2025-06-03 ENCOUNTER — Ambulatory Visit: Admitting: Dermatology

## 2025-08-04 ENCOUNTER — Ambulatory Visit: Admitting: Obstetrics and Gynecology
